# Patient Record
Sex: Female | Born: 1980 | Race: Black or African American | Hispanic: No | Marital: Single | Smoking: Never smoker
Health system: Southern US, Community
[De-identification: ages and names within clinical notes are randomized; demographics above are authoritative.]

## PROBLEM LIST (undated history)

## (undated) DIAGNOSIS — J45909 Unspecified asthma, uncomplicated: Secondary | ICD-10-CM

## (undated) DIAGNOSIS — L309 Dermatitis, unspecified: Secondary | ICD-10-CM

## (undated) HISTORY — DX: Dermatitis, unspecified: L30.9

## (undated) HISTORY — PX: HERNIA REPAIR: SHX51

---

## 2004-10-21 ENCOUNTER — Emergency Department (HOSPITAL_COMMUNITY): Admission: EM | Admit: 2004-10-21 | Discharge: 2004-10-21 | Payer: Self-pay | Admitting: Emergency Medicine

## 2005-07-07 ENCOUNTER — Emergency Department (HOSPITAL_COMMUNITY): Admission: EM | Admit: 2005-07-07 | Discharge: 2005-07-08 | Payer: Self-pay | Admitting: Emergency Medicine

## 2005-08-21 ENCOUNTER — Inpatient Hospital Stay (HOSPITAL_COMMUNITY): Admission: AD | Admit: 2005-08-21 | Discharge: 2005-08-21 | Payer: Self-pay | Admitting: Family Medicine

## 2005-09-03 ENCOUNTER — Inpatient Hospital Stay (HOSPITAL_COMMUNITY): Admission: AD | Admit: 2005-09-03 | Discharge: 2005-09-03 | Payer: Self-pay | Admitting: Obstetrics

## 2005-09-15 ENCOUNTER — Observation Stay (HOSPITAL_COMMUNITY): Admission: AD | Admit: 2005-09-15 | Discharge: 2005-09-15 | Payer: Self-pay | Admitting: Obstetrics

## 2005-10-09 ENCOUNTER — Ambulatory Visit (HOSPITAL_COMMUNITY): Admission: RE | Admit: 2005-10-09 | Discharge: 2005-10-09 | Payer: Self-pay | Admitting: Obstetrics

## 2005-11-20 ENCOUNTER — Inpatient Hospital Stay (HOSPITAL_COMMUNITY): Admission: AD | Admit: 2005-11-20 | Discharge: 2005-11-21 | Payer: Self-pay | Admitting: Obstetrics & Gynecology

## 2005-11-27 ENCOUNTER — Emergency Department: Payer: Self-pay | Admitting: Emergency Medicine

## 2006-02-17 ENCOUNTER — Inpatient Hospital Stay: Payer: Self-pay | Admitting: Obstetrics & Gynecology

## 2006-05-12 DIAGNOSIS — J45909 Unspecified asthma, uncomplicated: Secondary | ICD-10-CM | POA: Insufficient documentation

## 2006-12-27 ENCOUNTER — Emergency Department: Payer: Self-pay | Admitting: Emergency Medicine

## 2007-10-30 ENCOUNTER — Emergency Department: Payer: Self-pay | Admitting: Emergency Medicine

## 2011-08-10 DIAGNOSIS — F319 Bipolar disorder, unspecified: Secondary | ICD-10-CM | POA: Insufficient documentation

## 2012-10-01 ENCOUNTER — Ambulatory Visit: Payer: Self-pay | Admitting: Oncology

## 2012-10-13 ENCOUNTER — Inpatient Hospital Stay: Payer: Self-pay | Admitting: Student

## 2012-10-13 LAB — COMPREHENSIVE METABOLIC PANEL
Albumin: 3.6 g/dL (ref 3.4–5.0)
Alkaline Phosphatase: 111 U/L (ref 50–136)
Anion Gap: 6 — ABNORMAL LOW (ref 7–16)
BUN: 9 mg/dL (ref 7–18)
Bilirubin,Total: 0.6 mg/dL (ref 0.2–1.0)
Calcium, Total: 8.7 mg/dL (ref 8.5–10.1)
Chloride: 106 mmol/L (ref 98–107)
Co2: 23 mmol/L (ref 21–32)
Creatinine: 0.95 mg/dL (ref 0.60–1.30)
EGFR (African American): >60
EGFR (Non-African Amer.): >60
Glucose: 114 mg/dL — ABNORMAL HIGH (ref 65–99)
Osmolality: 270 (ref 275–301)
Potassium: 3.6 mmol/L (ref 3.5–5.1)
SGOT(AST): 43 U/L — ABNORMAL HIGH (ref 15–37)
SGPT (ALT): 55 U/L (ref 12–78)
Sodium: 135 mmol/L — ABNORMAL LOW (ref 136–145)
Total Protein: 7.8 g/dL (ref 6.4–8.2)

## 2012-10-13 LAB — CBC
HCT: 43 % (ref 35.0–47.0)
HGB: 14.5 g/dL (ref 12.0–16.0)
MCH: 29.4 pg (ref 26.0–34.0)
MCHC: 33.7 g/dL (ref 32.0–36.0)
MCV: 87 fL (ref 80–100)
Platelet: 207 10*3/uL (ref 150–440)
RBC: 4.94 10*6/uL (ref 3.80–5.20)
RDW: 13.2 % (ref 11.5–14.5)
WBC: 5.9 10*3/uL (ref 3.6–11.0)

## 2012-10-13 LAB — CK TOTAL AND CKMB (NOT AT ARMC)
CK, Total: 182 U/L (ref 21–215)
CK-MB: 2.8 ng/mL (ref 0.5–3.6)

## 2012-10-13 LAB — TROPONIN I: Troponin-I: <0.02 ng/mL

## 2012-10-14 LAB — DRUG SCREEN, URINE
Amphetamines, Ur Screen: NEGATIVE (ref ?–1000)
Barbiturates, Ur Screen: NEGATIVE (ref ?–200)
Benzodiazepine, Ur Scrn: NEGATIVE (ref ?–200)
Cannabinoid 50 Ng, Ur ~~LOC~~: POSITIVE (ref ?–50)
Cocaine Metabolite,Ur ~~LOC~~: POSITIVE (ref ?–300)
MDMA (Ecstasy)Ur Screen: NEGATIVE (ref ?–500)
Methadone, Ur Screen: NEGATIVE (ref ?–300)
Opiate, Ur Screen: POSITIVE (ref ?–300)
Phencyclidine (PCP) Ur S: NEGATIVE (ref ?–25)
Tricyclic, Ur Screen: NEGATIVE (ref ?–1000)

## 2012-10-14 LAB — RAPID HIV-1/2 QL/CONFIRM: HIV-1/2,Rapid Ql: NEGATIVE

## 2012-10-14 LAB — PREGNANCY, URINE: Pregnancy Test, Urine: NEGATIVE m[IU]/mL

## 2012-10-14 LAB — LACTATE DEHYDROGENASE: LDH: 409 U/L — ABNORMAL HIGH (ref 81–246)

## 2012-10-15 LAB — COMPREHENSIVE METABOLIC PANEL
Albumin: 3.1 g/dL — ABNORMAL LOW (ref 3.4–5.0)
Alkaline Phosphatase: 108 U/L (ref 50–136)
Anion Gap: 4 — ABNORMAL LOW (ref 7–16)
BUN: 6 mg/dL — ABNORMAL LOW (ref 7–18)
Bilirubin,Total: 0.3 mg/dL (ref 0.2–1.0)
Calcium, Total: 8.8 mg/dL (ref 8.5–10.1)
Chloride: 104 mmol/L (ref 98–107)
Co2: 26 mmol/L (ref 21–32)
Creatinine: 0.99 mg/dL (ref 0.60–1.30)
EGFR (African American): >60
EGFR (Non-African Amer.): >60
Glucose: 94 mg/dL (ref 65–99)
Osmolality: 266 (ref 275–301)
Potassium: 4.1 mmol/L (ref 3.5–5.1)
SGOT(AST): 42 U/L — ABNORMAL HIGH (ref 15–37)
SGPT (ALT): 47 U/L (ref 12–78)
Sodium: 134 mmol/L — ABNORMAL LOW (ref 136–145)
Total Protein: 7.3 g/dL (ref 6.4–8.2)

## 2012-10-17 LAB — PLATELET COUNT: Platelet: 238 10*3/uL (ref 150–440)

## 2012-10-19 LAB — CULTURE, BLOOD (SINGLE)

## 2012-10-24 ENCOUNTER — Ambulatory Visit: Payer: Self-pay | Admitting: Internal Medicine

## 2012-10-27 LAB — BRONCHIAL WASH CULTURE

## 2012-10-28 LAB — PATHOLOGY REPORT

## 2012-11-14 LAB — CULTURE, FUNGUS WITHOUT SMEAR

## 2013-01-16 ENCOUNTER — Emergency Department: Payer: Self-pay | Admitting: Emergency Medicine

## 2013-01-20 LAB — BETA STREP CULTURE(ARMC)

## 2013-04-15 ENCOUNTER — Emergency Department: Payer: Self-pay | Admitting: Emergency Medicine

## 2014-11-23 NOTE — H&P (Signed)
PATIENT NAME:  Yvette Jimenez, Yvette Jimenez MR#:  [id number] DATE OF BIRTH:  1980-09-10  DATE OF ADMISSION:  10/14/2012  PRIMARY CARE PHYSICIAN:  Dr. Kate Sable.   REFERRING PHYSICIAN:  Dr. Enedina Finner.   CHIEF COMPLAINT:  Right-sided chest pain and shortness of breath.   HISTORY OF PRESENT ILLNESS:  The patient is a 34 year old African American female with healthy past medical history apart from asthma.  She is also alcoholic.  She works as a Leisure centre manager.  She was in her usual state of health until about the last 24 hours when she woke up with right-sided chest pain described as sharp pain.  Severity is about 8 on a scale of 10.  It is pleuritic in nature, worse upon deep breathing and also whenever she lies down.  This is associated with shortness of breath.  She has a little cough, but she states it is not unusual for her to have a cough.  She has low-grade fever reaching 100.2.  The patient also describes some chills yesterday.  Prior to that, she was fine.  Evaluation here at the Emergency Department with chest x-ray and CAT scan of the chest with IV contrast showed no evidence of pulmonary embolism, however there are patchy infiltrates of pneumonia in her lungs.  Additionally, there is a prominent hilar and mediastinal lymphadenopathy raising suspicion concern about whether she has lymphoma.  The patient was admitted for further evaluation and treatment.   REVIEW OF SYSTEMS:  CONSTITUTIONAL:  Reports a low-grade fever.  Here it measured 100.2.  She had a few chills yesterday.  Denies night sweats, she said mild, but this is not unusual for her.  No fatigue.  No recent history of weight loss.  EYES:  No blurring of vision.  No double vision.  EARS, NOSE, THROAT:  No hearing impairment.  No sore throat.  No dysphagia.  CARDIOVASCULAR:  Reported chest pain as above and shortness of breath.  No edema.  No syncope.  RESPIRATORY:  A little cough if any, chest pain and shortness of breath as above.  No hemoptysis.  No  sputum production.  GASTROINTESTINAL:  No abdominal pain, no vomiting, no diarrhea.  GENITOURINARY:  No dysuria.  No frequency of urination.  No vaginal bleed.  MUSCULOSKELETAL:  No joint pain or swelling.  No muscular pain or swelling.  INTEGUMENTARY:  No skin rash.  No ulcers.  NEUROLOGY:  No focal weakness.  No seizure activity.  No headache.  PSYCHIATRY:  No anxiety.  No depression.  ENDOCRINE:  No polyuria or polydipsia.  No heat or cold intolerance.  HEMATOLOGY:  No easy bruisability.  No lymph node enlargement by palpation.   PAST MEDICAL HISTORY:  Asthma, otherwise healthy.   PAST SURGICAL HISTORY:  Right inguinal hernia repair in 2002.   SOCIAL HABITS:  Nonsmoker, however she drinks alcohol, usually tequila and vodka, about five shots a day, at least for the last three months.  No other drug abuse.   SOCIAL HISTORY:  She is married, but right now she is separated.  She works as a Leisure centre manager.   FAMILY HISTORY:  She indicates that her mother has a hole in her heart.  She has no information about her father.  She has brothers and sisters.  All are healthy.   ADMISSION MEDICATIONS:  None except for occasional albuterol as needed use and she uses Depo injections every few months.  She did not have any menstrual period for the last six months.   ALLERGIES:  DEMEROL CAUSING SIDE EFFECTS WITH NAUSEA AND VOMITING.  LEVAQUIN AND MOTRIN.   PHYSICAL EXAMINATION: VITAL SIGNS:  Blood pressure 128/69, respiratory rate 24, pulse 90, temperature 98.9, repeat temperature was 100.2.  Oxygen saturation 98%.  GENERAL APPEARANCE:  Young female lying in bed in no acute distress.  HEAD AND NECK:  No pallor.  No icterus.  No cyanosis.  EARS, NOSE, THROAT:  Ear examination revealed normal hearing.  No discharge.  No ulcers.  Nasal mucosa was normal without ulcers, no bleeding.  No discharge.  Oropharyngeal area was normal without ulcers, no oral thrush.  EYES:  Revealed normal eyelids and conjunctivae.   Pupils about 6 mm, equal and reactive to light.  NECK:  Supple.  Trachea at midline.  No thyromegaly.  No cervical lymphadenopathy.  No submental lymph nodes or supraclavicular.   HEART:  Revealed normal S1, S2.  No S3 or S4.  No murmur.  No gallop.  No carotid bruits.  RESPIRATORY:  Revealed normal breathing pattern without use of accessory muscles.  No rales.  No wheezing.  ABDOMEN:  Soft without tenderness.  No hepatosplenomegaly.  No masses.  No hernias.  SKIN:  Revealed no ulcers.  No subcutaneous nodules.  MUSCULOSKELETAL:  No joint swelling.  No clubbing.  NEUROLOGIC:  Cranial nerves II through XII are intact.  No focal motor deficit.  PSYCHIATRIC:  The patient is alert and oriented x 3.  Mood and affect were normal.  LYMPHATIC:  No cervical or supraclavicular lymph nodes.  No axillary or inguinal lymph nodes.   LABORATORY FINDINGS AND RADIOLOGIC DATA:  Chest x-ray showed mediastinal and hilar lymphadenopathy associated with bilateral increased interstitial markings and confluent density in the right apex.  CAT scan of the chest with contrast showed extensive soft tissue density in the mediastinal and bilateral hilar regions concerning for possible lymphoma.  Patchy and bilateral infiltrates in both lungs.  No evidence of large central pulmonary embolic filling defects.  Serum glucose 114, BUN 9, creatinine 0.9, sodium 135, potassium 3.6.  Liver function tests were normal.  AST is slightly elevated at 43, normal ALT of 55.  CPK 185.  Troponin less than 0.02.  CBC showed white count of 5000, hemoglobin 14, hematocrit 43, platelet count 207.   ASSESSMENT: 1.  Patchy pneumonia.  2.  Pleuritic chest pain.  3.  Hilar and mediastinal lymphadenopathy raising suspicion for lymphoma.   4.  Chronic alcoholism.  5.  Mild intermittent asthma.   PLAN:  We will admit the patient to the medical floor.  Blood cultures x 2.  Empiric IV antibiotic using Rocephin and Zithromax.  Check HIV status.  Also, I  will check LDH.  DuoNebs treatment.  Oxygen supplementation.  Consult infectious disease speciality and consult hematology oncology.  Watch for any withdrawal symptoms from alcohol, although patient states that she is fine and at times she may not drink without DTs.  I will add multivitamin and thiamine.  Ativan 1 mg q. 4 hours as needed.  Urine for pregnancy test.   TIME SPENT IN EVALUATING THIS PATIENT:  Took more than 55 minutes.     ____________________________ Carney CornersAmir M. Rudene Rearwish, MD amd:ea D: 10/14/2012 00:21:45 ET T: 10/14/2012 01:09:04 ET JOB#: [id number]  cc: Carney CornersAmir M. Rudene Rearwish, MD, <Dictator> Karolee OhsAMIR Dala DockM DARWISH MD ELECTRONICALLY SIGNED 10/14/2012 6:20

## 2014-11-23 NOTE — Consult Note (Signed)
CT scan of the abdomen and pelvis was unrevealing.  Malignancy is unlikely,  but still in the differential.  Agree with Dr. Clovis FredricksonKasa's assessment that patient will require bronchoscopy.  Recommend repeating CT scan in 3 months to assess for interval change.  Appreciate consult, call with questions.  Electronic Signatures: Gerarda FractionFinnegan, Gretchen Weinfeld (MD)  (Signed on 16-Mar-14 10:34)  Authored  Last Updated: 16-Mar-14 10:34 by Gerarda FractionFinnegan, Avenell Sellers (MD)

## 2014-11-23 NOTE — Consult Note (Signed)
History of Present Illness:  Reason for Consult Mediastinal lymphadenopathy, concerns for underlying lymphoma.   HPI   Patient is a 34 year old female with no significant past medical history who noted some increasing fatigue and low-grade fevers over the past 3-4 days.  Prior to this she was in her usual state of health.  She developed acute onset chest pain which brought her daily are for further evaluation.  Currently her symptoms have improved.  She has no neurologic complaints.  She is a good appetite and denies weight loss.  She denies any cough or shortness of breath.  She has no night sweats.  She denies any nausea, vomiting, constipation, or diarrhea.  She has no urinary complaints.  Patient otherwise feels well an further specific complaints.  PFSH:  Additional Past Medical and Surgical History Asthma, right inguinal hernia repair in 2002.   Social history: Daily alcohol use, positive cocaine, denies tobacco.    Family history:  Negative and noncontributory.   Review of Systems:  Performance Status (ECOG) 0   Review of Systems   As per HPI. Otherwise, 10 point system review was negative.   NURSING NOTES: **Vital Signs.:   14-Mar-14 13:56   Vital Signs Type: Routine   Temperature Temperature (F): 98.4   Celsius: 36.8   Temperature Source: oral   Pulse Pulse: 104   Respirations Respirations: 20   Systolic BP Systolic BP: 710   Diastolic BP (mmHg) Diastolic BP (mmHg): 74   Mean BP: 85   Pulse Ox % Pulse Ox %: 95   Pulse Ox Activity Level: At rest   Oxygen Delivery: Room Air/ 21 %   Physical Exam:  Physical Exam General: Well-developed, well-nourished, no acute distress. Eyes: Pink conjunctiva, anicteric sclera. HEENT: Normocephalic, moist mucous membranes, clear oropharnyx. Lungs: Clear to auscultation bilaterally. Heart: Regular rate and rhythm. No rubs, murmurs, or gallops. Abdomen: Soft, nontender, nondistended. No organomegaly noted, normoactive  bowel sounds. Musculoskeletal: No edema, cyanosis, or clubbing. Neuro: Alert, answering all questions appropriately. Cranial nerves grossly intact. Skin: No rashes or petechiae noted. Psych: Normal affect. Lymphatics: No cervical, calvicular, axillary or inguinal LAD.    Demerol: N/V  Levaquin: Unknown  Motrin: Unknown  Laboratory Results: Hepatic:  13-Mar-14 16:19   Bilirubin, Total 0.6  Alkaline Phosphatase 111  SGPT (ALT) 55  SGOT (AST)  43  Total Protein, Serum 7.8  Albumin, Serum 3.6  Routine Chem:  13-Mar-14 16:19   LDH, Serum  409 (Result(s) reported on 14 Oct 2012 at 12:28AM.)  Glucose, Serum  114  BUN 9  Creatinine (comp) 0.95  Sodium, Serum  135  Potassium, Serum 3.6  Chloride, Serum 106  CO2, Serum 23  Calcium (Total), Serum 8.7  Osmolality (calc) 270  eGFR (African American) >60  eGFR (Non-African American) >60 (eGFR values <32m/min/1.73 m2 may be an indication of chronic kidney disease (CKD). Calculated eGFR is useful in patients with stable renal function. The eGFR calculation will not be reliable in acutely ill patients when serum creatinine is changing rapidly. It is not useful in  patients on dialysis. The eGFR calculation may not be applicable to patients at the low and high extremes of body sizes, pregnant women, and vegetarians.)  Anion Gap  6  Cardiac:  13-Mar-14 16:19   CK, Total 182  CPK-MB, Serum 2.8 (Result(s) reported on 13 Oct 2012 at 04:47PM.)  Troponin I < 0.02 (0.00-0.05 0.05 ng/mL or less: NEGATIVE  Repeat testing in 3-6 hrs  if clinically indicated. >0.05 ng/mL: POTENTIAL  MYOCARDIAL INJURY. Repeat  testing in 3-6 hrs if  clinically indicated. NOTE: An increase or decrease  of 30% or more on serial  testing suggests a  clinically important change)  Routine Sero:  13-Mar-14 16:19   Rapid HIV 1/2 Ab Test with Confirmation ([facility]) NEG - HIV 1/2 AB This is a screening test for the presence of HIV-1/2 antibodies. False-negative  and false-positive results can occur. All preliminary positive samples are sent for confirmatory testing. Clinical correlation is necessary to assess whether repeat testing may be needed for negative results.  Routine Hem:  13-Mar-14 16:19   WBC (CBC) 5.9  RBC (CBC) 4.94  Hemoglobin (CBC) 14.5  Hematocrit (CBC) 43.0  Platelet Count (CBC) 207 (Result(s) reported on 13 Oct 2012 at 04:39PM.)  MCV 87  MCH 29.4  MCHC 33.7  RDW 13.2   Assessment and Plan: Impression:   Lymphadenopathy. Plan:   1.  Lymphadenopathy: Possibly reactive in nature secondary to her underlying pulmonary symptoms.  Will get CT of the abdomen and pelvis to assess for any further lymphadenopathy.  Case was discussed with Dr. Clayborn Bigness and agree with pulmonary consult for bronchoscopy and possible biopsy of her subcarinal lymph node which is enlarged.  If abdomen and pelvis CT is negative and bronchoscopy in either negative or inconclusive, would recommend repeating scan in 6-8 weeks after completion of antibiotic treatment to assess for interval change.  For completeness, peripheral blood flow cytometry was ordered but these results will not be available for 7-10 days. consult, will follow.  Electronic Signatures: Delight Hoh (MD)  (Signed 14-Mar-14 17:41)  Authored: HISTORY OF PRESENT ILLNESS, PFSH, ROS, NURSING NOTES, PE, ALLERGIES, LABS, ASSESSMENT AND PLAN   Last Updated: 14-Mar-14 17:41 by Delight Hoh (MD)

## 2014-11-23 NOTE — Consult Note (Signed)
PATIENT NAME:  Yvette Jimenez, Yvette Jimenez MR#:  [id number] DATE OF BIRTH:  1981-07-05  DATE OF CONSULTATION:  10/14/2012  REFERRING PHYSICIAN:  Dr. Winona LegatoVaickute. CONSULTING PHYSICIAN:  Rosalyn GessMichael E. Blocker, MD  REASON FOR CONSULTATION:  Abnormal CT scan.   HISTORY OF PRESENT ILLNESS:  The patient is a 34 year old female with a past history significant for exercise-induced asthma who was admitted on March 13 with relatively acute onset of right-sided chest pains and shortness of breath.  The patient states that she was in her usual state of health until a day before admission when she awoke with right-sided sharp chest pains below the right breast.  The pains radiated into the back.  She also had significant shortness of breath.  The pain was worse with deep inspiration.  It was also worse with lying down.  She had some cough, but she has some at baseline.  She has had no sputum production.  She denies any fevers, chills and sweats, although she had a temperature in the Emergency Room of 100.2.  She was admitted to the hospital and started on ceftriaxone and azithromycin.  A CT scan showed evidence for lymph node enlargement and a patchy infiltrate.  She has had no known sick contacts. She did have some swollen anterior cervical/submandibular nodes a few weeks ago, but these have decreased in size and are not currently bothering her.  She has not had any other URI symptoms.  She has not had any nausea, vomiting or change in her bowels.  She has multiple tattoos, but have always obtained them from a licensed parlor.  She has no injecting drug use history.  She has had no rashes.   ALLERGIES:  DEMEROL, MOTRIN AND LEVAQUIN, THE LATTER CAUSED A LOCAL RASH AROUND THE IV SITE THAT RESOLVED AFTER STOPPING THE INFUSION.   SOCIAL HISTORY:  The patient is married, but separated.  She works as a Leisure centre managerbartender.  She does not smoke.  She drinks heavily.  No injecting drug use history.  Multiple tattoos.    FAMILY HISTORY:  No history of  sarcoidosis.   REVIEW OF SYSTEMS:  GENERAL:  No fevers, chills or sweats.  No malaise.  No fatigue.  HEENT:  Some headache with coughing at times.  No sinus congestion.  No sore throat.  No nasal congestion.  NECK:  No stiffness.  She had some swollen glands in the neck a few weeks ago, but these improved.  RESPIRATORY:  Positive pleuritic chest pain and shortness of breath.  No significant sputum production.  Minimal cough.  CARDIAC:  Chest pains on the right as described above radiating to the back.  She has had some peripheral edema over the last month that is typically worse at the end of the day and gets better when she puts her feet up.  No palpitations.  GASTROINTESTINAL:  No nausea, no vomiting, no abdominal pain, no change in her bowels.  GENITOURINARY:  No change in her urine.  MUSCULOSKELETAL:  No myalgias or arthralgias.  No frank joint arthritis.  SKIN:  No rashes.  NEUROLOGIC:  No focal weakness.  PSYCHIATRIC:  No complaints.  All other systems are negative.   PHYSICAL EXAMINATION: VITAL SIGNS:  T-max of 98.4, T-current of 98.4.  She did have a temperature of 100.2 in the ER.  Pulse of 104, blood pressure 109/74, 95% on room air.  GENERAL:  A 34 year old black female in no acute distress.  HEENT:  Normocephalic, atraumatic.  Pupils equal and reactive to light.  Extraocular motion intact.  Sclerae, conjunctivae and lids without evidence for emboli or petechiae.  Oropharynx shows no erythema or exudate.  Teeth and gums are in good condition.  NECK:  Supple.  Full range of motion.  Midline trachea.  No lymphadenopathy.  No thyromegaly. LUNGS:  Clear to auscultation bilaterally.  She did have some discomfort with deep inspiration on the right.  She did have some discomfort on the right with deep inspiration.  CARDIAC:  Regular rate and rhythm without murmur, rub or gallop.  ABDOMEN:  Soft, nontender and nondistended.  No hepatosplenomegaly.  No hernias noted.  EXTREMITIES:  No  evidence for tenosynovitis.  SKIN:  She had multiple tattoos, none of which had any inflammatory reaction.  She had no other rashes.  No stigmata of endocarditis, specifically no Janeway lesions or Osler nodes.  NEUROLOGIC:  The patient was awake and interactive, moving all four extremities.  PSYCHIATRIC:  Mood and affect appeared normal.   LABORATORY DATA:  BUN of 9, creatinine 0.95, bicarbonate 23, anion gap of 6, AST 43, ALT 55, alkaline phosphatase 111, total bilirubin of 0.6.  White count of 5.9 with a hemoglobin 14.5, platelet count of 207.  Blood cultures show no growth to date.  A urine pregnancy test was negative.  Rapid HIV test was negative.  Chest x-ray showed mediastinal and hilar lymphadenopathy with bilateral interstitial markings.  A CT scan of the chest with contrast demonstrated extensive soft tissue density in the mediastinal and bilateral hilar regions in the lungs concerning for possible lymphoma, patchy bilateral infiltrates in both lungs in multiple locations.  There was no evidence for PE.   IMPRESSION:  A 34 year old female with a history of exercise-induced asthma admitted with an abnormal CT scan.   RECOMMENDATIONS:   1.  Her presentation of pain and shortness of breath without significant cough or sputum production and no fevers, chills or sweats (except for minimal temperature elevation on admission) with normal white count is not very suggestive of pneumonia.  She has been seen by oncology for possible lymphoma and they do not think that that diagnosis is too likely.  She is to go for a CT of the abdomen and pelvis to look for additional lymph nodes.  She has both lymphadenopathy and patchy infiltrates on CT.  This appears to be consistent with acute sarcoidosis.  I cannot rule out an atypical infection, however.  2.  We will ask pulmonary to see her for possible biopsy to look for sarcoidosis,  3.  We will change her antibiotics to doxycycline.  We will treat for 7 days.   4.  Her HIV test was negative making opportunistic infections unlikely.  Acute HIV would be a possibility, however.  We will send an HIV PCR.  5.  Cocaine effects of the lungs would be possible.  We will send a tox screen.    This is a highly complex infectious disease case.  Thank you very much for involving me in this patient's care.       ____________________________ Rosalyn Gess. Blocker, MD meb:ea D: 10/14/2012 15:27:00 ET T: 10/15/2012 00:26:15 ET JOB#: [id number]  cc: Rosalyn Gess. Blocker, MD, <Dictator> MICHAEL E BLOCKER MD ELECTRONICALLY SIGNED 10/17/2012 9:39

## 2014-11-23 NOTE — Discharge Summary (Signed)
PATIENT NAME:  Yvette Jimenez, Yvette Jimenez MR#:  [id number] DATE OF BIRTH:  January 03, 1981  DATE OF ADMISSION:  10/13/2012 DATE OF DISCHARGE:  10/17/2012  CONSULTANTS: Dr. Belia Heman from Pulmonary; Dr. Orlie Dakin from Hematology/Oncology; and Dr. Leavy Cella from Infectious Disease.   PRIMARY CARE PHYSICIAN: Dr. Kate Sable.    CHIEF COMPLAINT:  Right-sided chest pain; shortness of breath.   DISCHARGE DIAGNOSES: 1.  Chest pain, with diffuse pulmonary infiltrate and significant mediastinal and bilateral hilar region lymphadenopathy, unknown etiology; lymphoma versus reactive versus sarcoid.  2.  Possible atypical pneumonia.  3.  Polysubstance abuse including cocaine and marijuana.  4.  Mild hyponatremia.   DISCHARGE MEDICATIONS: Prednisone 50 mg for a week, 1 tablet daily; doxycycline  100 mg 2 times a day for 4 more days.   DISPOSITION: Home.   CODE STATUS: THE PATIENT IS A FULL CODE.   DIET: Regular.   ACTIVITY: As tolerated.   FOLLOWUP: Please follow with Dr. Belia Heman on Monday the 24th at 11:00 a.m. for endoscopic ultrasound. Please follow up with PCP within 1 to 2 weeks and undergo a repeat chest imaging for the lymphadenopathy.   LABS AND IMAGING: CT scan of chest with contrast showing extensive soft tissue density in the mediastinal and bilateral hilar region in the lung, concerning for possible lymphoma. Patchy bilateral infiltrates in both lungs, multiple locations. No thoracic aortic aneurysm or dissection. CT of abdomen and pelvis with contrast showing no lymphadenopathy in the abdomen or pelvis. Mild basilar pulmonary opacities, infectious or inflammatory in nature. X-ray of the chest, PA and lateral, showing mediastinal and hilar lymphadenopathy with bilateral increased interstitial markings. Initial sodium 135. LFTs on arrival: AST was 43, otherwise within normal limits. Troponin negative. U-tox positive for cocaine, cannabinoids, and opiates, which was taken the day after admission, however. WBC 5.9 on  arrival, hemoglobin 14.5 platelets are 207. Blood cultures: No growth to date. Rapid HIV negative. Pregnancy test negative. Echocardiogram showing normal EF.  HISTORY OF PRESENT ILLNESS AND HOSPITAL COURSE: For full details of the H and P, please see the dictation on 10/13/2012 by Dr. Rudene Re, but briefly this is a 34 year old African American female with a history of asthma, alcohol use, who presented with right-sided chest pain which was described as sharp, 8 out of 10, pleuritic in nature, associated with deep breathing and laying down. She was admitted to the Hospitalist Service for further evaluation and management as initial x-ray showed patchy, possibly pneumonia, with lymphadenopathy. CAT scan was checked. HIV status was checked. Infectious Disease and Oncology were consulted, as well as Pulmonary. The patient underwent a CAT scan of the chest with contrast, the result of which is dictated above. She was initially started on community-acquired pneumonia coverage, antibiotics; and after the patient was seen by Dr. Leavy Cella, they were changed to doxycycline, to be taken for 7 days for possible atypical pneumonia. However, the possibility of lymphoma versus sarcoid was raised. Furthermore, cocaine defect on the lung could also be possible, per Infectious Disease. She was seen by Dr. Belia Heman, and we had arranged an endoscopic ultrasound and a biopsy for this Wednesday, which the patient adamantly refused, stating that she has to go to work on Wednesday and could not be here. At this point, as she is asymptomatic, we will discharge her, and her endoscopic ultrasound with biopsy has been scheduled for the following Monday. At this point, she is not hypoxic, is not short of breath, and ambulating without significant symptoms. She was seen by Dr. Orlie Dakin from Oncology  as well. It is possible that the patient has lymphoma; however, we would need a definitive diagnosis for that. It is recommended for her to follow with  a CAT scan in 3 months, and she verbalized understanding. At this point, she will be discharged with prednisone for 7 days in case this is sarcoid per Dr. Belia HemanKasa.  TOTAL TIME SPENT: 40 minutes.   The patient is FULL CODE.     ____________________________ Krystal EatonShayiq Kazi Reppond, MD sa:dm D: 10/17/2012 11:01:25 ET T: 10/17/2012 11:57:33 ET JOB#: [id number]  cc: Krystal EatonShayiq Priest Lockridge, MD, <Dictator> Deloris PingPhilip J. Luella Cookosenow, MD Dory LarsenKurian D. Kasa, MD Tollie Pizzaimothy J. Orlie DakinFinnegan, MD    Krystal EatonSHAYIQ Zed Wanninger MD ELECTRONICALLY SIGNED 10/25/2012 13:27

## 2014-11-23 NOTE — Consult Note (Signed)
Impression:    34yo female w/ h/o exercise induced asthma admitted with abnormal CT.     Her presentation of pain and SOB without significant cough or sputum production and no fever, chills or sweats (except temp on admission) with normal WBC is not very suggestive of pneumonia.  She has been seen by oncology for possible lymphoma and they do not think that diagnosis too likely.  To go for CT abd/pelvis to look for additional LNs.  She has both LAN and patchy infiltrate on CT.  This appears to be consistent with acute sarcoidosis.  I cannot rule out an atypical infection, however.    Will ask pulmonary to see her for possible biopsy to look for sarcoidosis.    Will change her antibiotics to doxycycline.  Would treat for 7 days.    HIV test was negative making opportunistic infe5)     Acute HIV would be possible.  Will send an HIV PCR.ns unlikely.5    Acute HIV would be possible.  Will send an HIV PCR. 6)     Cocaine effects on the lungs would be possible.  Will send a tox screen.  Electronic Signatures: Blocker MPH, Rosalyn GessMichael E (MD) (Signed on 14-Mar-14 15:17)  Authored   Last Updated: 14-Mar-14 15:27 by Blocker MPH, Rosalyn GessMichael E (MD)
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
## 2018-01-30 ENCOUNTER — Emergency Department
Admission: EM | Admit: 2018-01-30 | Discharge: 2018-01-30 | Disposition: A | Discharge status: Home / Self Care | Payer: Self-pay | Attending: Emergency Medicine | Admitting: Emergency Medicine

## 2018-01-30 ENCOUNTER — Encounter: Payer: Self-pay | Admitting: Emergency Medicine

## 2018-01-30 ENCOUNTER — Other Ambulatory Visit: Payer: Self-pay

## 2018-01-30 DIAGNOSIS — H00012 Hordeolum externum right lower eyelid: Secondary | ICD-10-CM | POA: Insufficient documentation

## 2018-01-30 MED ORDER — POLYMYXIN B-TRIMETHOPRIM 10000-0.1 UNIT/ML-% OP SOLN: 2.0000 [drp] | Freq: Four times a day (QID) | OPHTHALMIC | 0 refills | Status: DC

## 2018-01-30 NOTE — ED Triage Notes (Signed)
Pt in via POV with complaints of stye and irritation to right eye since Friday.  Pt reports worsening irritation, burning, swelling.  Vitals WDL, NAD noted at this time.

## 2018-01-30 NOTE — ED Notes (Signed)
First Nurse Note: Pt to ED c/o eye pain. Pt is in NAD at this time.

## 2018-01-30 NOTE — ED Provider Notes (Signed)
[facility] Emergency Department Provider Note  ____________________________________________  Time seen: Approximately 3:53 PM  I have reviewed the triage vital signs and the nursing notes.   HISTORY  Chief Complaint Eye Pain    HPI Yvette Jimenez is a 37 y.o. female who presents the emergency department complaining of pain, swelling, redness to the right lower eyelid.  Patient reports that she has a hard "knot" in the right lateral aspect of the eyelid.  This was appreciated 2 days ago.  Patient denies any visual changes, drainage, conjunctival irritation or erythema.  No trauma to the eye.  Patient does not wear glasses or contacts.  Patient has attempted cold packs, heat packs, saline eyedrops.  Patient denies any other complaints.  No other symptoms.  No prescription or over-the-counter medications for this complaint.  History reviewed. No pertinent past medical history.  There are no active problems to display for this patient.   Past Surgical History:  Procedure Laterality Date  . HERNIA REPAIR      Prior to Admission medications   Medication Sig Start Date End Date Taking? Authorizing Provider  trimethoprim-polymyxin b (POLYTRIM) ophthalmic solution Place 2 drops into the right eye every 6 (six) hours. 01/30/18   Jameria Bradway, Delorise Royals, PA-C    Allergies Levaquin [levofloxacin] and Latex  No family history on file.  Social History Social History   Tobacco Use  . Smoking status: Never Smoker  . Smokeless tobacco: Never Used  Substance Use Topics  . Alcohol use: Yes  . Drug use: Yes    Types: Marijuana     Review of Systems  Constitutional: No fever/chills Eyes: No visual changes. No discharge.  "Knot" to the right lower eyelid ENT: No upper respiratory complaints. Cardiovascular: no chest pain. Respiratory: no cough. No SOB. Gastrointestinal: No abdominal pain.  No nausea, no vomiting.   Musculoskeletal: Negative for musculoskeletal  pain. Skin: Negative for rash, abrasions, lacerations, ecchymosis. Neurological: Negative for headaches, focal weakness or numbness. 10-point ROS otherwise negative.  ____________________________________________   PHYSICAL EXAM:  VITAL SIGNS: ED Triage Vitals  Enc Vitals Group     BP 01/30/18 1534 121/88     Pulse Rate 01/30/18 1534 78     Resp 01/30/18 1534 16     Temp 01/30/18 1534 98.5 F (36.9 C)     Temp Source 01/30/18 1534 Oral     SpO2 01/30/18 1534 100 %     Weight 01/30/18 1535 160 lb (72.6 kg)     Height 01/30/18 1535 5\' 7"  (1.702 m)     Head Circumference --      Peak Flow --      Pain Score 01/30/18 1534 6     Pain Loc --      Pain Edu? --      Excl. in GC? --      Constitutional: Alert and oriented. Well appearing and in no acute distress. Eyes: Conjunctivae are normal. PERRL. EOMI. visualization of the right lower eyelid reveals tender, mobile, lesion to the right lateral lower eyelid.  Minimal erythema.  Minimal surrounding edema.  No indication of cellulitis. Head: Atraumatic. ENT:      Ears:       Nose: No congestion/rhinnorhea.      Mouth/Throat: Mucous membranes are moist.  Neck: No stridor.    Cardiovascular: Normal rate, regular rhythm. Normal S1 and S2.  Good peripheral circulation. Respiratory: Normal respiratory effort without tachypnea or retractions. Lungs CTAB. Good air entry to the bases  with no decreased or absent breath sounds. Musculoskeletal: Full range of motion to all extremities. No gross deformities appreciated. Neurologic:  Normal speech and language. No gross focal neurologic deficits are appreciated.  Skin:  Skin is warm, dry and intact. No rash noted. Psychiatric: Mood and affect are normal. Speech and behavior are normal. Patient exhibits appropriate insight and judgement.   ____________________________________________   LABS (all labs ordered are listed, but only abnormal results are displayed)  Labs Reviewed - No data to  display ____________________________________________  EKG   ____________________________________________  RADIOLOGY   No results found.  ____________________________________________    PROCEDURES  Procedure(s) performed:    Procedures    Medications - No data to display   ____________________________________________   INITIAL IMPRESSION / ASSESSMENT AND PLAN / ED COURSE  Pertinent labs & imaging results that were available during my care of the patient were reviewed by me and considered in my medical decision making (see chart for details).  Review of the [place] CSRS was performed in accordance of the [facility] prior to dispensing any controlled drugs.      Patient's diagnosis is consistent with hordeolum to the right lower eyelid.  Patient presents the emergency department complaining of "knot" to the right lower eyelid.  Exam is most consistent with a hordeolum.  Differential included hordeolum, preseptal cellulitis, periorbital cellulitis, conjunctivitis.  Exam was overall reassuring with no indication for further work-up.  Patient is encouraged to use warm hot compresses for same.  If symptoms do not improve, patient will be prescribed an antibiotic eyedrop.  If no improvement after 2 to 3 weeks, patient will follow-up with ophthalmology for incision and drainage.. Patient will be discharged home with prescriptions for Polytrim. Patient is to follow up with ophthalmology as needed or otherwise directed. Patient is given ED precautions to return to the ED for any worsening or new symptoms.     ____________________________________________  FINAL CLINICAL IMPRESSION(S) / ED DIAGNOSES  Final diagnoses:  Hordeolum externum of right lower eyelid      NEW MEDICATIONS STARTED DURING THIS VISIT:  ED Discharge Orders        Ordered    trimethoprim-polymyxin b (POLYTRIM) ophthalmic solution  Every 6 hours     01/30/18 1621          This chart was dictated using  voice recognition software/Dragon. Despite best efforts to proofread, errors can occur which can change the meaning. Any change was purely unintentional.    Racheal PatchesCuthriell, Aneri Slagel D, PA-C 01/30/18 1626    Don PerkingVeronese, [place], MD 01/31/18 2252

## 2018-04-20 LAB — HM PAP SMEAR: HM Pap smear: POSITIVE

## 2018-05-16 ENCOUNTER — Ambulatory Visit: Payer: Self-pay | Attending: Oncology

## 2018-05-16 ENCOUNTER — Ambulatory Visit: Payer: Self-pay | Admitting: Obstetrics and Gynecology

## 2018-12-22 LAB — HM HIV SCREENING LAB: HM HIV Screening: NEGATIVE

## 2019-03-15 DIAGNOSIS — F319 Bipolar disorder, unspecified: Secondary | ICD-10-CM

## 2019-03-16 ENCOUNTER — Encounter: Payer: Self-pay | Admitting: Physician Assistant

## 2019-03-16 ENCOUNTER — Ambulatory Visit (LOCAL_COMMUNITY_HEALTH_CENTER): Payer: Medicaid Other | Admitting: Physician Assistant

## 2019-03-16 ENCOUNTER — Other Ambulatory Visit: Payer: Self-pay

## 2019-03-16 VITALS — BP 101/70 | Ht 67.0 in | Wt 188.2 lb

## 2019-03-16 DIAGNOSIS — Z3042 Encounter for surveillance of injectable contraceptive: Secondary | ICD-10-CM

## 2019-03-16 DIAGNOSIS — Z113 Encounter for screening for infections with a predominantly sexual mode of transmission: Secondary | ICD-10-CM

## 2019-03-16 DIAGNOSIS — Z01419 Encounter for gynecological examination (general) (routine) without abnormal findings: Secondary | ICD-10-CM | POA: Diagnosis not present

## 2019-03-16 DIAGNOSIS — Z30013 Encounter for initial prescription of injectable contraceptive: Secondary | ICD-10-CM | POA: Diagnosis not present

## 2019-03-16 DIAGNOSIS — Z3009 Encounter for other general counseling and advice on contraception: Secondary | ICD-10-CM | POA: Diagnosis not present

## 2019-03-16 LAB — WET PREP FOR TRICH, YEAST, CLUE
Trichomonas Exam: NEGATIVE
Yeast Exam: NEGATIVE

## 2019-03-16 MED ORDER — THERA VITAL M PO TABS: 1.0000 | ORAL_TABLET | Freq: Every day | ORAL | 0 refills | Status: DC

## 2019-03-16 MED ORDER — MEDROXYPROGESTERONE ACETATE 150 MG/ML IM SUSP
150.0000 mg | Freq: Once | INTRAMUSCULAR | Status: AC
  Administered 2019-03-16: 10:00:00 150 mg via INTRAMUSCULAR

## 2019-03-16 NOTE — Progress Notes (Addendum)
Here for Depo at 12 wks, since last Depo. (Last PE 04/2018). Also here for STD screening.Jenetta Downer, RN

## 2019-03-16 NOTE — Progress Notes (Signed)
Wet mount reviewed, no treatment indicated. Depo given, left deltoid, tolerated well, next Depo card given..Britain Anagnos Brewer-Jensen, RN  

## 2019-03-17 NOTE — Progress Notes (Signed)
Family Planning Visit- Repeat Yearly Visit  Subjective:  Elder Cypherseko C Hightower is a 38 y.o. being seen today for a Depo visit and requests a STD screening as well.    She is currently using Depo-Provera injections for pregnancy prevention. Patient reports she does not want a pregnancy in the next year. Patient  has Bipolar depression (H[facility]) and Asthma on their problem list.  Chief Complaint  Patient presents with  . Contraception  . SEXUALLY TRANSMITTED DISEASE    Patient reports that she is doing well with the Depo and wants to continue with it for now.  Reports that she has been drinking a lot of soda recently and urinating more frequently.   Patient denies any vaginal symptoms, any changes to person and family history.   Does the patient desire a pregnancy in the next year? (OKQ flowsheet)  See flowsheet for other program required questions.   Body mass index is 29.48 kg/m. - Patient is eligible for diabetes screening based on BMI and age 76>40?  not applicable HA1C ordered? not applicable  Patient reports 1 of partners in last year. Desires STI screening?  Yes  Does the patient have a current or past history of drug use? No   No components found for: HCV]   Health Maintenance Due  Topic Date Due  . TETANUS/TDAP  06/13/1999  . INFLUENZA VACCINE  03/04/2019    Review of Systems  All other systems reviewed and are negative.   The following portions of the patient's history were reviewed and updated as appropriate: allergies, current medications, past family history, past medical history, past social history, past surgical history and problem list. Problem list updated.  Objective:   Vitals:   03/16/19 0902  BP: 101/70  Weight: 188 lb 3.2 oz (85.4 kg)  Height: 5\' 7"  (1.702 m)    Physical Exam Vitals signs reviewed.  Constitutional:      General: She is not in acute distress.    Appearance: Normal appearance.  HENT:     Head: Normocephalic and atraumatic.      Mouth/Throat:     Mouth: Mucous membranes are moist.     Pharynx: Oropharynx is clear. No oropharyngeal exudate or posterior oropharyngeal erythema.  Neck:     Musculoskeletal: Neck supple.  Pulmonary:     Effort: Pulmonary effort is normal.  Abdominal:     Palpations: Abdomen is soft. There is no mass.     Tenderness: There is no abdominal tenderness. There is no guarding or rebound.  Genitourinary:    General: Normal vulva.     Rectum: Normal.     Comments: External genitalia/pubic area without nits, lice, edema, erythema, lesions and inguinal adenopathy. Vagina with normal mucosa and discharge. Cervix without visible lesions. Uterus normal size, firm, mobile, nt, no CMT, no masses, no adnexal tenderness or fullness.  Lymphadenopathy:     Cervical: No cervical adenopathy.  Skin:    General: Skin is warm and dry.     Findings: No bruising, erythema or rash.     Comments: Multiple tattoos  Neurological:     Mental Status: She is alert and oriented to person, place, and time.  Psychiatric:        Mood and Affect: Mood normal.        Behavior: Behavior normal.        Thought Content: Thought content normal.        Judgment: Judgment normal.       Assessment and Plan:  [facility]  TELIYAH ROYAL is a 38 y.o. female presenting to the [facility] Department for a family planning visit  Contraception counseling: Reviewed all forms of birth control options available including abstinence; over the counter/barrier methods; hormonal contraceptive medication including pill, patch, ring, injection,contraceptive implant; hormonal and nonhormonal IUDs; permanent sterilization options including vasectomy and the various tubal sterilization modalities. Risks and benefits reviewed.  Questions were answered.  Patient desires to continue with Depo , this was prescribed for patient. She will follow up in  3 months for surveillance.  She was told to call with any further questions, or with any  concerns about this method of contraception.  Emphasized use of condoms 100% of the time for STI prevention.to co  1. Encounter for counseling regarding contraception Continue with Depo 150 mg IM q 11-13 weeks x 2 Rec condoms with all sex for STD protection. RTC in ~12 weeks for RP if possible and Depo only if not doing PE yet. - medroxyPROGESTERone (DEPO-PROVERA) injection 150 mg - Multiple Vitamins-Minerals (MULTIVITAMIN) tablet; Take 1 tablet by mouth daily.  Dispense: 100 tablet; Refill: 0  2. Screening for STD (sexually transmitted disease) Counseled that RN will call if needs to RTC for treatment once results are back. - WET PREP FOR [place], YEAST, CLUE - Chlamydia/Gonorrhea [place] [facility] - HIV [place] [facility] - Syphilis Serology, [place] [facility]  3. Gynecologic exam normal Pelvic for STD screening done and normal.  - WET PREP FOR [place], YEAST, CLUE - Chlamydia/Gonorrhea [place] [facility]  4. Surveillance for Depo-Provera contraception OK for Depo today. RTC for next shot in 11-13 weeks. - medroxyPROGESTERone (DEPO-PROVERA) injection 150 mg  5. Family planning RN gave MVI per patient request. - Multiple Vitamins-Minerals (MULTIVITAMIN) tablet; Take 1 tablet by mouth daily.  Dispense: 100 tablet; Refill: 0     Return in about 11 weeks (around 06/01/2019) for Depo, Physical exam.  No future appointments.  Jerene Dilling, PA

## 2019-06-01 ENCOUNTER — Encounter: Payer: Self-pay | Admitting: Physician Assistant

## 2019-06-01 ENCOUNTER — Other Ambulatory Visit: Payer: Self-pay

## 2019-06-01 ENCOUNTER — Ambulatory Visit (LOCAL_COMMUNITY_HEALTH_CENTER): Payer: Medicaid Other | Admitting: Physician Assistant

## 2019-06-01 VITALS — BP 115/78 | Ht 68.0 in | Wt 188.0 lb

## 2019-06-01 DIAGNOSIS — Z3009 Encounter for other general counseling and advice on contraception: Secondary | ICD-10-CM | POA: Diagnosis not present

## 2019-06-01 DIAGNOSIS — Z30013 Encounter for initial prescription of injectable contraceptive: Secondary | ICD-10-CM | POA: Diagnosis not present

## 2019-06-01 DIAGNOSIS — Z3042 Encounter for surveillance of injectable contraceptive: Secondary | ICD-10-CM

## 2019-06-01 DIAGNOSIS — Z01419 Encounter for gynecological examination (general) (routine) without abnormal findings: Secondary | ICD-10-CM | POA: Diagnosis not present

## 2019-06-01 DIAGNOSIS — Z113 Encounter for screening for infections with a predominantly sexual mode of transmission: Secondary | ICD-10-CM

## 2019-06-01 LAB — WET PREP FOR TRICH, YEAST, CLUE
Trichomonas Exam: NEGATIVE
Yeast Exam: NEGATIVE

## 2019-06-01 MED ORDER — MEDROXYPROGESTERONE ACETATE 150 MG/ML IM SUSP
150.0000 mg | INTRAMUSCULAR | Status: AC
  Administered 2019-06-01: 150 mg via INTRAMUSCULAR

## 2019-06-01 NOTE — Progress Notes (Signed)
Depo given per C. [place] PA order. Tolerated well. Wet mount reviewed; no treatment per C. [place] PA. Aileen Fass, RN

## 2019-06-01 NOTE — Progress Notes (Signed)
Here today for Depo and STD screening. See FYI for last RP, CBE and Pap info. Hal Morales, RN

## 2019-06-01 NOTE — Progress Notes (Signed)
Family Planning Visit-  Subjective:  Yvette Jimenez is a 38 y.o. being seen today for Depo shot and requests STD screening today.    She is currently using Depo-Provera injections for pregnancy prevention. Patient reports she does not  want a pregnancy in the next year. Patient  has Bipolar depression ([place]) and Asthma on their problem list.  Chief Complaint  Patient presents with  . Contraception    Depo  . SEXUALLY TRANSMITTED DISEASE    STD screening    Patient reports that she is doing well with the Depo and desires to continue with this as BCM.  States that she likes to have screening q 3 months with depo even if she is not having symptoms.  States that she has not had any follow up re:  Pap with abnl cells from last year.    Patient denies any change in personal and family history since last RP.  Denies any symptoms and concerns today.    Does the patient desire a pregnancy in the next year? (OKQ flowsheet)  See flowsheet for other program required questions.   Body mass index is 28.59 kg/m. - Patient is eligible for diabetes screening based on BMI and age >06?  not applicable YI9S ordered? not applicable  Patient reports 1 of partners in last year. Desires STI screening?  Yes  Does the patient have a current or past history of drug use? No   No components found for: HCV]   Health Maintenance Due  Topic Date Due  . TETANUS/TDAP  06/13/1999    Review of Systems  All other systems reviewed and are negative.   The following portions of the patient's history were reviewed and updated as appropriate: allergies, current medications, past family history, past medical history, past social history, past surgical history and problem list. Problem list updated.  Objective:   Vitals:   06/01/19 0904  BP: 115/78  Weight: 188 lb (85.3 kg)  Height: 5\' 8"  (1.727 m)    Physical Exam Vitals signs reviewed.  Constitutional:      General: She is not in acute distress.  Appearance: Normal appearance.  HENT:     Head: Normocephalic and atraumatic.     Mouth/Throat:     Mouth: Mucous membranes are moist.     Pharynx: Oropharynx is clear. No oropharyngeal exudate or posterior oropharyngeal erythema.  Eyes:     Conjunctiva/sclera: Conjunctivae normal.  Neck:     Musculoskeletal: Neck supple.  Pulmonary:     Effort: Pulmonary effort is normal.  Abdominal:     Palpations: Abdomen is soft. There is no mass.     Tenderness: There is no abdominal tenderness. There is no guarding or rebound.  Genitourinary:    General: Normal vulva.     Rectum: Normal.     Comments: External genitalia/pubic area without nits, lice, edema, erythema, lesions and inguinal adenopathy. Vagina with normal mucosa and discharge. Cervix without visible lesions. Uterus firm, mobile, nt, no masses, no CMT, no adnexal tenderness or fullness. Lymphadenopathy:     Cervical: No cervical adenopathy.  Skin:    General: Skin is warm and dry.     Findings: No bruising, erythema, lesion or rash.  Neurological:     Mental Status: She is alert and oriented to person, place, and time.  Psychiatric:        Mood and Affect: Mood normal.        Behavior: Behavior normal.        Thought  Content: Thought content normal.        Judgment: Judgment normal.       Assessment and Plan:  Yvette Jimenez is a 38 y.o. female presenting to the [facility] Department for an initial well woman exam/family planning visit  Contraception counseling: Reviewed all forms of birth control options available including abstinence; over the counter/barrier methods; hormonal contraceptive medication including pill, patch, ring, injection,contraceptive implant; hormonal and nonhormonal IUDs; permanent sterilization options including vasectomy and the various tubal sterilization modalities. Risks and benefits reviewed.  Questions were answered.  Written information was also given to the patient to review.   Patient desires to continue with Depo, this was prescribed for patient. She will follow up in  3 months and prn for surveillance.  She was told to call with any further questions, or with any concerns about this method of contraception.  Emphasized use of condoms 100% of the time for STI prevention.  1. Encounter for counseling regarding contraception Counseled patient re:  SE of Depo and enc MVI 1 po QD, added Calcium with Vitamin D for bone health. Counseled re:  Weight bearing exercise. Rec condoms with all sex. - medroxyPROGESTERone (DEPO-PROVERA) injection 150 mg  2. Screening for STD (sexually transmitted disease) Patient without symptoms today. Await test results.  Counseled that RN will call if needs to RTC for any treatment once results are back.  - WET PREP FOR [place], YEAST, CLUE - Chlamydia/Gonorrhea [place] [facility] - HIV [place] [facility] - Syphilis Serology, [place] [facility]  3. Surveillance for Depo-Provera contraception OK for Depo 150mg  IM q 11-13 weeks for 1 year. Reviewed when to call clinic for irregular bleeding. - medroxyPROGESTERone (DEPO-PROVERA) injection 150 mg  4. Gynecologic exam normal Counseled that she should get a call or letter re:  Pap results in 2-3 weeks once results are back. - IGP, Aptima HPV     Return in about 11 weeks (around 08/17/2019) for Depo.  No future appointments.  08/19/2019, PA

## 2019-06-12 LAB — IGP, APTIMA HPV
HPV Aptima: POSITIVE — AB
PAP Smear Comment: 0

## 2019-07-21 ENCOUNTER — Encounter: Payer: Self-pay | Admitting: Physician Assistant

## 2019-07-21 NOTE — Progress Notes (Signed)
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  Rec that patient be referred to colpo.

## 2019-07-24 NOTE — Progress Notes (Signed)
TC to patient. Discussed pap results and recommended follow up. Strongly encouraged colpo and patient declines at this time.  Patient states she "feels fine" and "why fix something if it's not broke".  Educated patient re: why we do cervical screening and importance to do follow up and colpo as early intervention to prevent cervical cancer.  Patient declines colpo appt. Instructed to call RN back if she changes her mind.  RN will send Mychart message to patient with RN contact info. Aileen Fass, RN

## 2019-07-25 ENCOUNTER — Encounter: Payer: Self-pay | Admitting: Physician Assistant

## 2019-07-25 NOTE — Progress Notes (Signed)
Reviewed RN note re: discussion with patient about pap results and recommendation to proceed to colpo.  Patient declines colpo at this time and was encouraged to call back if she changes her mind.  In this case, would recommend repeating cotest pap in 1 year if patient does not have colpo done.

## 2019-07-29 NOTE — Progress Notes (Signed)
Patient added to pap f/u log for pap screening in 1year. Aileen Fass, RN

## 2019-08-25 ENCOUNTER — Ambulatory Visit (LOCAL_COMMUNITY_HEALTH_CENTER): Payer: Medicaid Other | Admitting: Family Medicine

## 2019-08-25 ENCOUNTER — Other Ambulatory Visit: Payer: Self-pay

## 2019-08-25 VITALS — BP 112/80 | Ht 67.0 in | Wt 191.2 lb

## 2019-08-25 DIAGNOSIS — Z30013 Encounter for initial prescription of injectable contraceptive: Secondary | ICD-10-CM | POA: Diagnosis not present

## 2019-08-25 DIAGNOSIS — Z3042 Encounter for surveillance of injectable contraceptive: Secondary | ICD-10-CM

## 2019-08-25 DIAGNOSIS — Z113 Encounter for screening for infections with a predominantly sexual mode of transmission: Secondary | ICD-10-CM

## 2019-08-25 DIAGNOSIS — Z3009 Encounter for other general counseling and advice on contraception: Secondary | ICD-10-CM

## 2019-08-25 LAB — WET PREP FOR TRICH, YEAST, CLUE
Trichomonas Exam: NEGATIVE
Yeast Exam: NEGATIVE

## 2019-08-25 MED ORDER — THERA VITAL M PO TABS: 1.0000 | ORAL_TABLET | Freq: Every day | ORAL | 3 refills | Status: AC

## 2019-08-25 MED ORDER — MEDROXYPROGESTERONE ACETATE 150 MG/ML IM SUSP
150.0000 mg | Freq: Once | INTRAMUSCULAR | Status: AC
  Administered 2019-08-25: 150 mg via INTRAMUSCULAR

## 2019-08-25 NOTE — Progress Notes (Signed)
In for Depo and STD screening-denies s/s Sharlette Dense, RN

## 2019-08-25 NOTE — Progress Notes (Signed)
Family Planning Visit  Subjective:  Yvette Jimenez is a 39 y.o. being seen today for  Chief Complaint  Patient presents with  . Contraception    Pt has Bipolar depression ([place]) and Asthma on their problem list.  HPI  Patient reports she is here for Depo, has been using depo x13 yrs. Also would like STI screening, denies symptoms.    No LMP recorded. Patient has had an injection.   Patient reports 3 partner(s) in last year. Do they desire STI screening (if no, why not)? yes  Does the patient desire a pregnancy in the next year? no   39 y.o., Body mass index is 29.95 kg/m. - Is patient eligible for HA1C diabetes screening based on BMI and age >43?  no  Does the patient have a current or past history of drug use? yes No components found for: HCV  See flowsheet for other program required questions.   Health Maintenance Due  Topic Date Due  . TETANUS/TDAP  06/13/1999    ROS  The following portions of the patient's history were reviewed and updated as appropriate: allergies, current medications, past family history, past medical history, past social history, past surgical history and problem list. Problem list updated.  Objective:  BP 112/80   Ht 5\' 7"  (1.702 m)   Wt 191 lb 3.2 oz (86.7 kg)   BMI 29.95 kg/m    Physical Exam  Vitals and nursing note reviewed.  Constitutional:      Appearance: Normal appearance.  HENT:     Head: Normocephalic and atraumatic.     Mouth/Throat:     Mouth: Mucous membranes are moist.     Pharynx: Oropharynx is clear. No oropharyngeal exudate or posterior oropharyngeal erythema.  Pulmonary:     Effort: Pulmonary effort is normal.  Abdominal:     General: Abdomen is flat.     Palpations: There is no mass.     Tenderness: There is no abdominal tenderness. There is no rebound.  Genitourinary:    Comments: Declines pelvic exam, prefers to self collect. Lymphadenopathy:     Head:     Right side of head: No preauricular or posterior  auricular adenopathy.     Left side of head: No preauricular or posterior auricular adenopathy.     Cervical: No cervical adenopathy.     Upper Body:     Right upper body: No supraclavicular or axillary adenopathy.     Left upper body: No supraclavicular or axillary adenopathy.  Skin:    General: Skin is warm and dry.     Findings: No rash.  Neurological:     Mental Status: She is alert and oriented to person, place, and time.      Assessment and Plan:  Yvette Jimenez is a 38 y.o. female presenting to the [facility] Department for a well woman exam/family planning visit  Contraception counseling: Reviewed all forms of birth control options in the tiered based approach including abstinence; over the counter/barrier methods; hormonal contraceptive medication including pill, patch, ring, injection, contraceptive implant; hormonal and nonhormonal IUDs; permanent sterilization options including vasectomy and the various tubal sterilization modalities. Risks, benefits, how to discontinue and typical effectiveness rates were reviewed.  Questions were answered.  Written information was also given to the patient to review.  Patient desires depo, this was prescribed for patient. She will follow up in  3 months for surveillance.  She was told to call with any further questions, or with any concerns  about this method of contraception.  Emphasized use of condoms 100% of the time for STI prevention.  Emergency Contraception: N/a - continuous use of depo   1. Encounter for surveillance of injectable contraceptive -Rx depo x1 yr. Counseling as above. -Risks of Depo use >2 yrs discussed with pt. Alternative BCM discussed, pt would like to continue Depo. Suggested calcium (1200 mg daily) and vitamin D (800 IU daily) supplements, weight bearing exercise and avoiding cigarette smoking and excessive alcohol consumption to promote bone health.  -Follow up in 3 months for repeat Depo Provera injection.   - medroxyPROGESTERone (DEPO-PROVERA) injection 150 mg  2. Screening examination for venereal disease -Screenings today as below. Treat wet prep per standing order. -Patient does meet criteria for HepB, HepC Screening. Declines these screenings. -Counseled on warning s/sx and when to seek care. Recommended condom use with all sex and discussed importance of condom use for STI prevention. - WET PREP FOR TRICH, YEAST, CLUE - Chlamydia/Gonorrhea [place] [facility] - HIV [place] [facility] - Syphilis Serology, [facility]   Return in about 3 months (around 11/23/2019) for Depo.  No future appointments.  Ann Held, PA-C

## 2019-11-17 ENCOUNTER — Other Ambulatory Visit: Payer: Self-pay

## 2019-11-17 ENCOUNTER — Ambulatory Visit (LOCAL_COMMUNITY_HEALTH_CENTER): Payer: Medicaid Other

## 2019-11-17 VITALS — BP 98/68 | Ht 67.0 in | Wt 192.0 lb

## 2019-11-17 DIAGNOSIS — Z3042 Encounter for surveillance of injectable contraceptive: Secondary | ICD-10-CM

## 2019-11-17 DIAGNOSIS — Z3009 Encounter for other general counseling and advice on contraception: Secondary | ICD-10-CM

## 2019-11-17 DIAGNOSIS — Z30013 Encounter for initial prescription of injectable contraceptive: Secondary | ICD-10-CM

## 2019-11-17 MED ORDER — MEDROXYPROGESTERONE ACETATE 150 MG/ML IM SUSP
150.0000 mg | Freq: Once | INTRAMUSCULAR | Status: AC
  Administered 2019-11-17: 150 mg via INTRAMUSCULAR

## 2019-11-17 NOTE — Progress Notes (Signed)
12.0 weeks post depo today.  DMPA 150 mg IM administered today per Samara Snide, PA order dated 08/25/2019.

## 2020-01-29 ENCOUNTER — Encounter: Payer: Self-pay | Admitting: Emergency Medicine

## 2020-01-29 ENCOUNTER — Emergency Department
Admission: EM | Admit: 2020-01-29 | Discharge: 2020-01-29 | Disposition: A | Discharge status: Home / Self Care | Payer: Self-pay | Attending: Emergency Medicine | Admitting: Emergency Medicine

## 2020-01-29 ENCOUNTER — Other Ambulatory Visit: Payer: Self-pay

## 2020-01-29 DIAGNOSIS — S0181XD Laceration without foreign body of other part of head, subsequent encounter: Secondary | ICD-10-CM | POA: Insufficient documentation

## 2020-01-29 DIAGNOSIS — J45909 Unspecified asthma, uncomplicated: Secondary | ICD-10-CM | POA: Insufficient documentation

## 2020-01-29 DIAGNOSIS — Z91040 Latex allergy status: Secondary | ICD-10-CM | POA: Insufficient documentation

## 2020-01-29 DIAGNOSIS — X58XXXD Exposure to other specified factors, subsequent encounter: Secondary | ICD-10-CM | POA: Insufficient documentation

## 2020-01-29 DIAGNOSIS — Z4802 Encounter for removal of sutures: Secondary | ICD-10-CM

## 2020-01-29 MED ORDER — IBUPROFEN 600 MG PO TABS: 600.0000 mg | ORAL_TABLET | Freq: Three times a day (TID) | ORAL | 0 refills | Status: AC | PRN

## 2020-01-29 MED ORDER — TRAMADOL HCL 50 MG PO TABS: 50.0000 mg | ORAL_TABLET | Freq: Four times a day (QID) | ORAL | 0 refills | Status: AC | PRN

## 2020-01-29 NOTE — ED Triage Notes (Signed)
Patient presents to the ED with suture removal from hand injury

## 2020-01-29 NOTE — ED Provider Notes (Signed)
[facility] Emergency Department Provider Note  Patient____________________________________________   First MD Initiated Contact with Patient 01/29/20 [phone]     (approximate)  I have reviewed the triage vital signs and the nursing notes.   HISTORY  Chief Complaint Suture / Staple Removal    HPI Yvette Jimenez is a 39 y.o. female patient presents for suture removal secondary to a laceration to the right lateral forehead.  Patient complain of pain and requesting numbing medicine before sutures are removed.         Past Medical History:  Diagnosis Date   Eczema     Patient Active Problem List   Diagnosis Date Noted   Bipolar depression ([place]) 08/10/2011   Asthma 05/12/2006    Past Surgical History:  Procedure Laterality Date   HERNIA REPAIR      Prior to Admission medications   Medication Sig Start Date End Date Taking? Authorizing Provider  ibuprofen (ADVIL) 600 MG tablet Take 1 tablet (600 mg total) by mouth every 8 (eight) hours as needed. 01/29/20   Sable Feil, PA-C  traMADol (ULTRAM) 50 MG tablet Take 1 tablet (50 mg total) by mouth every 6 (six) hours as needed for up to 3 days. 01/29/20 02/01/20  Sable Feil, PA-C    Allergies Levaquin [levofloxacin] and Latex  Family History  Problem Relation Age of Onset   Heart defect Mother    Bipolar disorder Mother    Diabetes Mother    Hypertension Mother    Diabetes Maternal Grandmother    Lung cancer Maternal Grandfather    Cancer Paternal Grandfather     Social History Social History   Tobacco Use   Smoking status: Never Smoker   Smokeless tobacco: Never Used  Vaping Use   Vaping Use: Former  Substance Use Topics   Alcohol use: Yes    Alcohol/week: 2.0 standard drinks    Types: 2 Shots of liquor per week   Drug use: Yes    Frequency: 3.0 times per week    Types: Marijuana    Review of Systems  Constitutional: No fever/chills Eyes: No visual  changes. ENT: No sore throat. Cardiovascular: Denies chest pain. Respiratory: Denies shortness of breath. Gastrointestinal: No abdominal pain.  No nausea, no vomiting.  No diarrhea.  No constipation. Genitourinary: Negative for dysuria. Musculoskeletal: Negative for back pain. Skin: Negative for rash. Neurological: Negative for headaches, focal weakness or numbness. Psychiatric:  Bipolar Endocrine:  Diabetes and hypertension Allergic/Immunilogical: Levaquin and latex ____________________________________________   PHYSICAL EXAM:  VITAL SIGNS: ED Triage Vitals  Enc Vitals Group     BP 01/29/20 0831 117/75     Pulse Rate 01/29/20 0831 78     Resp 01/29/20 0831 16     Temp 01/29/20 0831 98.6 F (37 C)     Temp src --      SpO2 01/29/20 0831 100 %     Weight 01/29/20 0831 180 lb (81.6 kg)     Height 01/29/20 0831 5\' 7"  (1.702 m)     Head Circumference --      Peak Flow --      Pain Score 01/29/20 0828 0     Pain Loc --      Pain Edu? --      Excl. in [place]? --     Constitutional: Alert and oriented.  Anxious  Cardiovascular: Normal rate, regular rhythm. Grossly normal heart sounds.  Good peripheral circulation. Respiratory: Normal respiratory effort.  No retractions. Lungs  CTAB. Neurologic:  Normal speech and language. No gross focal neurologic deficits are appreciated. No gait instability. Skin:  Skin is warm, dry and intact. No rash noted. Psychiatric: Mood and affect are normal. Speech and behavior are normal.  ____________________________________________   LABS (all labs ordered are listed, but only abnormal results are displayed)  Labs Reviewed - No data to display ____________________________________________  EKG   ____________________________________________  RADIOLOGY  ED MD interpretation:    Official radiology report(s): No results found.  ____________________________________________   PROCEDURES  Procedure(s) performed (including Critical  Care):  .Suture Removal  Date/Time: 01/29/2020 9:22 AM Performed by: Joni Reining, PA-C Authorized by: Joni Reining, PA-C   Consent:    Consent obtained:  Verbal   Consent given by:  Patient   Risks discussed:  Bleeding, pain and wound separation Location:    Location:  Head/neck   Head/neck location:  Forehead Procedure details:    Wound appearance:  No signs of infection, good wound healing and clean   Number of sutures removed:  3 Post-procedure details:    Patient tolerance of procedure:  Tolerated well, no immediate complications    ____________________________________________   INITIAL IMPRESSION / ASSESSMENT AND PLAN / ED COURSE  As part of my medical decision making, I reviewed the following data within the electronic MEDICAL RECORD NUMBER     Patient presents for suture removal right lateral forehead.  See procedure note.  Patient given discharge care instruction advised take medication as directed.    Yvette Jimenez was evaluated in Emergency Department on 01/29/2020 for the symptoms described in the history of present illness. She was evaluated in the context of the global COVID-19 pandemic, which necessitated consideration that the patient might be at risk for infection with the SARS-CoV-2 virus that causes COVID-19. Institutional protocols and algorithms that pertain to the evaluation of patients at risk for COVID-19 are in a state of rapid change based on information released by regulatory bodies including the CDC and federal and state organizations. These policies and algorithms were followed during the patient's care in the ED.       ____________________________________________   FINAL CLINICAL IMPRESSION(S) / ED DIAGNOSES  Final diagnoses:  Encounter for removal of sutures     ED Discharge Orders         Ordered    traMADol (ULTRAM) 50 MG tablet  Every 6 hours PRN     Discontinue  Reprint     01/29/20 0918    ibuprofen (ADVIL) 600 MG tablet  Every 8  hours PRN     Discontinue  Reprint     01/29/20 2841           Note:  This document was prepared using Dragon voice recognition software and may include unintentional dictation errors.    Joni Reining, PA-C 01/29/20 3244    Arnaldo Natal, MD 01/29/20 1027

## 2020-01-29 NOTE — ED Notes (Addendum)
See triage note  Presents for suture removal to head  Healing well

## 2020-02-20 ENCOUNTER — Telehealth: Payer: Self-pay | Admitting: General Practice

## 2020-02-20 NOTE — Telephone Encounter (Signed)
Individual has been contacted 3+ times regarding ED referral. No further attempts to contact individual will be made. 

## 2020-05-07 ENCOUNTER — Telehealth: Payer: Self-pay | Admitting: Licensed Clinical Social Worker

## 2020-05-07 NOTE — Telephone Encounter (Signed)
Patient left vm inquiring about counseling services.

## 2020-05-13 ENCOUNTER — Telehealth: Payer: Self-pay | Admitting: Licensed Clinical Social Worker

## 2020-05-13 NOTE — Telephone Encounter (Signed)
05/10/20- pt. Left vm for LCSW.

## 2020-05-14 ENCOUNTER — Ambulatory Visit: Payer: Medicaid Other

## 2020-05-21 ENCOUNTER — Telehealth: Payer: Self-pay | Admitting: Licensed Clinical Social Worker

## 2020-05-21 NOTE — Telephone Encounter (Signed)
LCSW called to remind patient about the need to sign consent prior to virtual appt. LCSW will follow up with patient and reschedule if consent is not signed.

## 2020-05-29 ENCOUNTER — Ambulatory Visit: Payer: Medicaid Other | Admitting: Licensed Clinical Social Worker

## 2020-06-11 ENCOUNTER — Other Ambulatory Visit: Payer: Self-pay

## 2020-06-11 ENCOUNTER — Ambulatory Visit (LOCAL_COMMUNITY_HEALTH_CENTER): Payer: Medicaid Other | Admitting: Physician Assistant

## 2020-06-11 VITALS — BP 109/71 | Ht 67.0 in | Wt 169.0 lb

## 2020-06-11 DIAGNOSIS — Z3009 Encounter for other general counseling and advice on contraception: Secondary | ICD-10-CM | POA: Diagnosis not present

## 2020-06-11 DIAGNOSIS — Z3042 Encounter for surveillance of injectable contraceptive: Secondary | ICD-10-CM

## 2020-06-11 MED ORDER — MEDROXYPROGESTERONE ACETATE 150 MG/ML IM SUSP
150.0000 mg | Freq: Once | INTRAMUSCULAR | Status: AC
  Administered 2020-06-11: 150 mg via INTRAMUSCULAR

## 2020-06-11 MED ORDER — MEDROXYPROGESTERONE ACETATE 150 MG/ML IM SUSP: 150.0000 mg | INTRAMUSCULAR | 0 refills | Status: DC

## 2020-06-11 NOTE — Progress Notes (Signed)
Patient here for depo. Last depo was 11/17/2019. Patient missed last depo. Last sex was 21-2 weeks ago per patient without condom. Patient does not have periods. Just occasional spotting. Harvie Heck, RN   Post:  Depo 150 mg IM admin in right arm . Patient tolerated well. Depo reminder card given.   Harvie Heck, RN

## 2020-06-12 NOTE — Progress Notes (Signed)
   [facility]  Subjective:  Yvette Jimenez is a 39 y.o. being seen today to restart Depo.  Chief Complaint  Patient presents with  . Contraception    restart depo    HPI  Patient states that she is here to restart Depo.  States that she last had sex about 2.5 weeks ago and she declines pregnancy test today since she has done one at home.  Per chart review, patient is due for RP when next Depo is due in January.   Does the patient have a current or past history of drug use? No   No components found for: HCV]   Health Maintenance Due  Topic Date Due  . Hepatitis C Screening  Never done  . TETANUS/TDAP  Never done  . INFLUENZA VACCINE  03/03/2020    Review of Systems  All other systems reviewed and are negative.   The following portions of the patient's history were reviewed and updated as appropriate: allergies, current medications, past family history, past medical history, past social history, past surgical history and problem list. Problem list updated.   See flowsheet for other program required questions.  Objective:   Vitals:   06/11/20 0917  BP: 109/71  Weight: 169 lb (76.7 kg)  Height: 5\' 7"  (1.702 m)    Physical Exam Vitals and nursing note reviewed.  Constitutional:      General: She is not in acute distress.    Appearance: Normal appearance. She is normal weight.  HENT:     Head: Normocephalic and atraumatic.  Eyes:     Conjunctiva/sclera: Conjunctivae normal.  Pulmonary:     Effort: Pulmonary effort is normal.  Skin:    General: Skin is warm and dry.  Neurological:     Mental Status: She is alert and oriented to person, place, and time.  Psychiatric:        Mood and Affect: Mood normal.        Behavior: Behavior normal.        Thought Content: Thought content normal.        Judgment: Judgment normal.       Assessment and Plan:  Yvette Jimenez is a 39 y.o. female presenting to  the [facility] Department for a Women's Health problem visit  1. Encounter for counseling regarding contraception Patient into clinic to restart Depo.  Declines pregnancy test today. Rec condoms with all sex for 2 weeks and check pregnancy test in 2 weeks. Counseled patient that she will be due for RP when next depo is due and that we will also repeat her pap at that visit.  2. Surveillance for Depo-Provera contraception OK for Depo today per 08/2019 order. - medroxyPROGESTERone (DEPO-PROVERA) injection 150 mg     Return in about 11 weeks (around 08/27/2020) for depo due date.  No future appointments.  08/29/2020, PA

## 2020-07-28 ENCOUNTER — Other Ambulatory Visit: Payer: Self-pay

## 2020-07-28 ENCOUNTER — Emergency Department
Admission: EM | Admit: 2020-07-28 | Discharge: 2020-07-28 | Disposition: A | Discharge status: Home / Self Care | Payer: HRSA Program | Attending: Emergency Medicine | Admitting: Emergency Medicine

## 2020-07-28 ENCOUNTER — Encounter: Payer: Self-pay | Admitting: Emergency Medicine

## 2020-07-28 DIAGNOSIS — U071 COVID-19: Secondary | ICD-10-CM | POA: Diagnosis not present

## 2020-07-28 DIAGNOSIS — Z5321 Procedure and treatment not carried out due to patient leaving prior to being seen by health care provider: Secondary | ICD-10-CM | POA: Diagnosis not present

## 2020-07-28 DIAGNOSIS — R059 Cough, unspecified: Secondary | ICD-10-CM | POA: Diagnosis present

## 2020-07-28 LAB — RESP PANEL BY RT-PCR (FLU A&B, COVID) ARPGX2
Influenza A by PCR: NEGATIVE
Influenza B by PCR: NEGATIVE
SARS Coronavirus 2 by RT PCR: POSITIVE — AB

## 2020-07-28 NOTE — ED Notes (Signed)
Date and time results received: 07/28/20 2005   Test: COVID Critical Value: POSITIVE Name of Provider Notified:   Orders Received? Or Actions Taken?: called pt with results.

## 2020-07-28 NOTE — ED Triage Notes (Signed)
Pt reported she was leaving and would look for her covid results

## 2020-07-28 NOTE — ED Triage Notes (Signed)
Pt ambulatory to triage without difficulty with c/o cough, bodyaches, fever and generally not feeling well for last 2 days.  Pt reports fever at home of 102.  States she last took ibuprofen around 9 or 10 this am.  Pt reports close contact with someone that has tested positive.

## 2020-07-29 ENCOUNTER — Other Ambulatory Visit (HOSPITAL_COMMUNITY): Payer: Self-pay | Admitting: Nurse Practitioner

## 2020-07-29 ENCOUNTER — Encounter: Payer: Self-pay | Admitting: Nurse Practitioner

## 2020-07-29 DIAGNOSIS — U071 COVID-19: Secondary | ICD-10-CM

## 2020-07-29 NOTE — Progress Notes (Signed)
I connected by phone with Elder Cyphers on 07/29/2020 at 11:01 AM to discuss the potential use of a treatment for mild to moderate COVID-19 viral infection in non-hospitalized patients.  This patient is a 39 y.o. female that meets the FDA criteria for Emergency Use Authorization of bamlanivimab/etesevimab, casirivimab\imdevimab, or sotrovimab  Has a (+) direct SARS-CoV-2 viral test result  Has mild or moderate COVID-19   Is ? 40 years of age and weighs ? 40 kg  Is NOT hospitalized due to COVID-19  Is NOT requiring oxygen therapy or requiring an increase in baseline oxygen flow rate due to COVID-19  Is within 10 days of symptom onset  Has at least one of the high risk factor(s) for progression to severe COVID-19 and/or hospitalization as defined in EUA.  Specific high risk criteria : Other high risk medical condition per CDC:  Social Vulnerability Index 4   Onset 12/25. Unvaccinated.    I have spoken and communicated the following to the patient or parent/caregiver:  1. FDA has authorized the emergency use of bamlanivimab/etesevimab, casirivimab\imdevimab, or sotrovimab for the treatment of mild to moderate COVID-19 in adults and pediatric patients with positive results of direct SARS-CoV-2 viral testing who are 33 years of age and older weighing at least 40 kg, and who are at high risk for progressing to severe COVID-19 and/or hospitalization.  2. The significant known and potential risks and benefits of bamlanivimab/etesevimab, casirivimab\imdevimab, or sotrovimab, and the extent to which such potential risks and benefits are unknown.  3. Information on available alternative treatments and the risks and benefits of those alternatives, including clinical trials.  4. Patients treated with bamlanivimab/etesevimab, casirivimab\imdevimab, or sotrovimab should continue to self-isolate and use infection control measures (e.g., wear mask, isolate, social distance, avoid sharing personal  items, clean and disinfect "high touch" surfaces, and frequent handwashing) according to CDC guidelines.   5. The patient or parent/caregiver has the option to accept or refuse bamlanivimab/etesevimab, casirivimab\imdevimab, or sotrovimab.  After reviewing this information with the patient, the patient has agreed to receive one of the available covid 19 monoclonal antibodies and will be provided an appropriate fact sheet prior to infusion.Consuello Masse, DNP, AGNP-C [phone] (Infusion Center Hotline)

## 2020-07-30 ENCOUNTER — Ambulatory Visit (HOSPITAL_COMMUNITY)
Admission: RE | Admit: 2020-07-30 | Discharge: 2020-07-30 | Disposition: A | Discharge status: Home / Self Care | Payer: HRSA Program | Source: Ambulatory Visit | Attending: Pulmonary Disease | Admitting: Pulmonary Disease

## 2020-07-30 DIAGNOSIS — U071 COVID-19: Secondary | ICD-10-CM | POA: Diagnosis present

## 2020-07-30 MED ORDER — DIPHENHYDRAMINE HCL 50 MG/ML IJ SOLN: 50.0000 mg | Freq: Once | INTRAMUSCULAR | Status: DC | PRN

## 2020-07-30 MED ORDER — ACETAMINOPHEN 325 MG PO TABS
650.0000 mg | ORAL_TABLET | Freq: Four times a day (QID) | ORAL | Status: DC | PRN
  Administered 2020-07-30: 650 mg via ORAL
  Filled 2020-07-30: qty 2

## 2020-07-30 MED ORDER — EPINEPHRINE 0.3 MG/0.3ML IJ SOAJ: 0.3000 mg | Freq: Once | INTRAMUSCULAR | Status: DC | PRN

## 2020-07-30 MED ORDER — METHYLPREDNISOLONE SODIUM SUCC 125 MG IJ SOLR: 125.0000 mg | Freq: Once | INTRAMUSCULAR | Status: DC | PRN

## 2020-07-30 MED ORDER — SODIUM CHLORIDE 0.9 % IV SOLN: INTRAVENOUS | Status: DC | PRN

## 2020-07-30 MED ORDER — FAMOTIDINE IN NACL 20-0.9 MG/50ML-% IV SOLN: 20.0000 mg | Freq: Once | INTRAVENOUS | Status: DC | PRN

## 2020-07-30 MED ORDER — SODIUM CHLORIDE 0.9 % IV SOLN: Freq: Once | INTRAVENOUS | Status: AC

## 2020-07-30 MED ORDER — SODIUM CHLORIDE 0.9 % IV BOLUS
1000.0000 mL | Freq: Once | INTRAVENOUS | Status: AC
  Administered 2020-07-30: 1000 mL via INTRAVENOUS

## 2020-07-30 MED ORDER — ALBUTEROL SULFATE HFA 108 (90 BASE) MCG/ACT IN AERS: 2.0000 | INHALATION_SPRAY | Freq: Once | RESPIRATORY_TRACT | Status: DC | PRN

## 2020-07-30 NOTE — Progress Notes (Signed)
Patient reviewed Fact Sheet for Patients, Parents, and Caregivers for Emergency Use Authorization (EUA) of bamlanivimab and etesevimab for the Treatment of Coronavirus. Patient also reviewed and is agreeable to the estimated cost of treatment. Patient is agreeable to proceed.   

## 2020-07-30 NOTE — Progress Notes (Signed)
  Diagnosis: COVID-19  Physician:Dr. Delford Field  Procedure: Covid Infusion Clinic Med: casirivimab\imdevimab infusion - Provided patient with casirivimab\imdevimab fact sheet for patients, parents and caregivers prior to infusion. Pt was given NS bolus d/t weakness, insufficient oral intake, tolerated the procedure.   Complications: No immediate complications noted.  Discharge: Discharged home . Per patient she felt better.  Robb Matar 07/30/2020

## 2020-07-30 NOTE — Discharge Instructions (Signed)
10 Things You Can Do to Manage Your COVID-19 Symptoms at Home If you have possible or confirmed COVID-19: 1. Stay home from work and school. And stay away from other public places. If you must go out, avoid using any kind of public transportation, ridesharing, or taxis. 2. Monitor your symptoms carefully. If your symptoms get worse, call your healthcare provider immediately. 3. Get rest and stay hydrated. 4. If you have a medical appointment, call the healthcare provider ahead of time and tell them that you have or may have COVID-19. 5. For medical emergencies, call 911 and notify the dispatch personnel that you have or may have COVID-19. 6. Cover your cough and sneezes with a tissue or use the inside of your elbow. 7. Wash your hands often with soap and water for at least 20 seconds or clean your hands with an alcohol-based hand sanitizer that contains at least 60% alcohol. 8. As much as possible, stay in a specific room and away from other people in your home. Also, you should use a separate bathroom, if available. If you need to be around other people in or outside of the home, wear a mask. 9. Avoid sharing personal items with other people in your household, like dishes, towels, and bedding. 10. Clean all surfaces that are touched often, like counters, tabletops, and doorknobs. Use household cleaning sprays or wipes according to the label instructions. cdc.gov/coronavirus 02/01/2019 This information is not intended to replace advice given to you by your health care provider. Make sure you discuss any questions you have with your health care provider. Document Revised: 07/06/2019 Document Reviewed: 07/06/2019 Elsevier Patient Education  2020 Elsevier Inc. What types of side effects do monoclonal antibody drugs cause?  Common side effects  In general, the more common side effects caused by monoclonal antibody drugs include: . Allergic reactions, such as hives or itching . Flu-like signs and  symptoms, including chills, fatigue, fever, and muscle aches and pains . Nausea, vomiting . Diarrhea . Skin rashes . Low blood pressure   The CDC is recommending patients who receive monoclonal antibody treatments wait at least 90 days before being vaccinated.  Currently, there are no data on the safety and efficacy of mRNA COVID-19 vaccines in persons who received monoclonal antibodies or convalescent plasma as part of COVID-19 treatment. Based on the estimated half-life of such therapies as well as evidence suggesting that reinfection is uncommon in the 90 days after initial infection, vaccination should be deferred for at least 90 days, as a precautionary measure until additional information becomes available, to avoid interference of the antibody treatment with vaccine-induced immune responses. If you have any questions or concerns after the infusion please call the Advanced Practice Provider on call at [phone]. This number is ONLY intended for your use regarding questions or concerns about the infusion post-treatment side-effects.  Please do not provide this number to others for use. For return to work notes please contact your primary care provider.   If someone you know is interested in receiving treatment please have them call the COVID hotline at [phone].   

## 2020-08-20 ENCOUNTER — Telehealth: Payer: Self-pay | Admitting: Family Medicine

## 2020-08-20 NOTE — Telephone Encounter (Signed)
Pt is due for Depo 1/25 but states she wants to get it early because she is bleeding. Pt states that she has been given her Depo early in the past for this reason.

## 2020-08-20 NOTE — Telephone Encounter (Signed)
Call to patient. Patient requesting her depo 1 week early, last depo 06/11/2020. Patient reports heavy bleeding. RN counseled patients on side effects of early depo. Patient verbalizes understanding but still wants depo early. Patient scheduled for 08/21/2020.  Harvie Heck, RN

## 2020-08-21 ENCOUNTER — Ambulatory Visit (LOCAL_COMMUNITY_HEALTH_CENTER): Payer: Medicaid Other

## 2020-08-21 ENCOUNTER — Other Ambulatory Visit: Payer: Self-pay

## 2020-08-21 VITALS — BP 96/65 | Ht 67.0 in | Wt 179.5 lb

## 2020-08-21 DIAGNOSIS — Z3009 Encounter for other general counseling and advice on contraception: Secondary | ICD-10-CM | POA: Diagnosis not present

## 2020-08-21 DIAGNOSIS — Z30013 Encounter for initial prescription of injectable contraceptive: Secondary | ICD-10-CM | POA: Diagnosis not present

## 2020-08-21 MED ORDER — MEDROXYPROGESTERONE ACETATE 150 MG/ML IM SUSP
150.0000 mg | Freq: Once | INTRAMUSCULAR | Status: AC
Start: 2020-08-21 — End: 2020-08-21
  Administered 2020-08-21: 150 mg via INTRAMUSCULAR

## 2020-08-21 NOTE — Progress Notes (Signed)
Patient is 10 w 1 d post last Depo today, but requested to be seen early due to a trip and no menses while on her trip. Hart Carwin, RN  Patient reports a menses that started on Monday 08/12/2020.  She thinks it was due to donating at the plasma center and the IV meds they give to prevent clotting?  She normally just spots once in a while.  She does report doing a couple of home UPT's and they were negative.   DMPA 150 mg given IM (Left Deltoid) today per 08/25/2019 order by Talmadge Chad  And tolerated well.  Patient aware of scheduling a physical next appointment and Depo will be given at that appointment. Hart Carwin, RN

## 2020-10-02 ENCOUNTER — Telehealth: Payer: Medicaid Other | Admitting: Nurse Practitioner

## 2020-10-02 ENCOUNTER — Telehealth: Payer: Medicaid Other | Admitting: Family Medicine

## 2020-10-02 DIAGNOSIS — R21 Rash and other nonspecific skin eruption: Secondary | ICD-10-CM

## 2020-10-02 NOTE — Progress Notes (Signed)
Based on what you shared with me it looks like you have a skin condition,that should be evaluated in a face to face office visit. I cannot make a proper diagnosis based on your answers and your picture sent. You will need a face to face visit for proper diagnosis and treatment.    NOTE: If you entered your credit card information for this eVisit, you will not be charged. You may see a "hold" on your card for the $35 but that hold will drop off and you will not have a charge processed.  If you are having a true medical emergency please call 911.     For an urgent face to face visit, [place] has four urgent care centers for your convenience:   . [facility]    [phone]                  Get Driving Directions  [address] [place], [place] [address] . 10 am to 8 pm Monday-Friday . 12 pm to 8 pm Saturday-Sunday   . [facility] at [facility]  [phone]                  Get Driving Directions  [address] [place], [place] [address] . 8 am to 8 pm Monday-Friday . 9 am to 6 pm Saturday . 11 am to 6 pm Sunday   . [facility] at [facility]  [phone]                  Get Driving Directions   [address] [place], [place] [address] . 8 am to 8 pm Monday-Friday . 8 am to 4 pm Saturday-Sunday    . [facility] at [facility] Directions  [phone]  [address], [address] [place], [place] [address]  . Monday-Friday, 12 PM to 6 PM    Your e-visit answers were reviewed by a board certified advanced clinical practitioner to complete your personal care plan.  Thank you for using e-Visits.

## 2020-10-03 ENCOUNTER — Telehealth: Payer: Medicaid Other

## 2020-10-03 ENCOUNTER — Other Ambulatory Visit: Payer: Self-pay

## 2020-10-03 ENCOUNTER — Telehealth: Payer: Self-pay

## 2020-10-03 ENCOUNTER — Ambulatory Visit
Admission: RE | Admit: 2020-10-03 | Discharge: 2020-10-03 | Disposition: A | Discharge status: Home / Self Care | Payer: Medicaid Other | Source: Ambulatory Visit | Attending: Family Medicine | Admitting: Family Medicine

## 2020-10-03 VITALS — BP 118/85 | HR 86 | Temp 98.2°F | Resp 18 | Ht 67.0 in | Wt 175.0 lb

## 2020-10-03 DIAGNOSIS — L659 Nonscarring hair loss, unspecified: Secondary | ICD-10-CM

## 2020-10-03 MED ORDER — CLOBETASOL PROPIONATE 0.05 % EX OINT: 1.0000 "application " | TOPICAL_OINTMENT | Freq: Every day | CUTANEOUS | 1 refills | Status: AC

## 2020-10-03 MED ORDER — BETAMETHASONE VALERATE 0.1 % EX OINT
1.0000 | TOPICAL_OINTMENT | Freq: Every day | CUTANEOUS | 2 refills | Status: AC
Start: 2020-10-03 — End: ?

## 2020-10-03 MED ORDER — CLOBETASOL PROPIONATE 0.05 % EX OINT: 1.0000 "application " | TOPICAL_OINTMENT | Freq: Every day | CUTANEOUS | 1 refills | Status: DC

## 2020-10-03 NOTE — ED Provider Notes (Signed)
[facility]    CSN: [id number] Arrival date & time: 10/03/20  1046      History   Chief Complaint Chief Complaint  Patient presents with  . Alopecia   HPI  40 year old female presents with hair loss.  Patient states that she used to have dreadlocks.  She states that after her dreadlocks were removed she had a small area of hair loss.  This was approximately 1 year ago.  She states over the past few months that her loss area has gotten worse.  It is quite large.  She is having no pain.  No itching.  Patient states that she has had this previously and resolved without treatment.  She is quite concerned and would like to be examined and treated today.  No other complaints at this time.  Past Medical History:  Diagnosis Date  . Eczema    Patient Active Problem List   Diagnosis Date Noted  . Bipolar depression (HCC) 08/10/2011  . Asthma 05/12/2006   Past Surgical History:  Procedure Laterality Date  . HERNIA REPAIR      OB History   No obstetric history on file.      Home Medications    Prior to Admission medications   Medication Sig Start Date End Date Taking? Authorizing Provider  betamethasone valerate ointment (VALISONE) 0.1 % Apply 1 application topically daily. 10/03/20  Yes Veleka Djordjevic G, DO  ibuprofen (ADVIL) 600 MG tablet Take 1 tablet (600 mg total) by mouth every 8 (eight) hours as needed. 01/29/20  Yes Joni Reining, PA-C  Multiple Vitamin (MULTIVITAMIN) capsule Take 1 capsule by mouth daily.   Yes [provider]  clobetasol ointment (TEMOVATE) 0.05 % Apply 1 application topically daily. 10/03/20   Tommie Sams, DO    Family History Family History  Problem Relation Age of Onset  . Heart defect Mother   . Bipolar disorder Mother   . Diabetes Mother   . Hypertension Mother   . Diabetes Maternal Grandmother   . Lung cancer Maternal Grandfather   . Cancer Paternal Grandfather     Social History Social History   Tobacco Use  .  Smoking status: Never Smoker  . Smokeless tobacco: Never Used  Vaping Use  . Vaping Use: Former  Substance Use Topics  . Alcohol use: Yes    Alcohol/week: 2.0 standard drinks    Types: 2 Shots of liquor per week  . Drug use: Yes    Frequency: 3.0 times per week    Types: Marijuana     Allergies   Levaquin [levofloxacin] and Latex   Review of Systems Review of Systems  Constitutional: Negative.   Skin:       Hair loss.   Physical Exam Triage Vital Signs ED Triage Vitals  Enc Vitals Group     BP 10/03/20 1115 118/85     Pulse Rate 10/03/20 1115 86     Resp 10/03/20 1115 18     Temp 10/03/20 1115 98.2 F (36.8 C)     Temp Source 10/03/20 1115 Oral     SpO2 10/03/20 1115 98 %     Weight 10/03/20 1113 175 lb (79.4 kg)     Height 10/03/20 1113 5\' 7"  (1.702 m)     Head Circumference --      Peak Flow --      Pain Score 10/03/20 1113 0     Pain Loc --      Pain Edu? --  Excl. in GC? --    Updated Vital Signs BP 118/85 (BP Location: Left Arm)   Pulse 86   Temp 98.2 F (36.8 C) (Oral)   Resp 18   Ht 5\' 7"  (1.702 m)   Wt 79.4 kg   SpO2 98%   BMI 27.41 kg/m   Visual Acuity Right Eye Distance:   Left Eye Distance:   Bilateral Distance:    Right Eye Near:   Left Eye Near:    Bilateral Near:     Physical Exam Vitals and nursing note reviewed.  Constitutional:      General: She is not in acute distress.    Appearance: Normal appearance. She is not ill-appearing.  HENT:     Head:     Comments: Large area of hair loss on the parietal scalp.  No scale or crusting.  No erythema. Eyes:     General:        Right eye: No discharge.        Left eye: No discharge.     Conjunctiva/sclera: Conjunctivae normal.  Pulmonary:     Effort: Pulmonary effort is normal. No respiratory distress.  Neurological:     Mental Status: She is alert.  Psychiatric:        Mood and Affect: Mood normal.        Behavior: Behavior normal.    UC Treatments / Results   Labs (all labs ordered are listed, but only abnormal results are displayed) Labs Reviewed - No data to display  EKG   Radiology No results found.  Procedures Procedures (including critical care time)  Medications Ordered in UC Medications - No data to display  Initial Impression / Assessment and Plan / UC Course  I have reviewed the triage vital signs and the nursing notes.  Pertinent labs & imaging results that were available during my care of the patient were reviewed by me and considered in my medical decision making (see chart for details).    40 year old female presents with alopecia.  This is a chronic issue.  I have advised that she needs to see dermatology for intralesional corticoid steroid injections.  In the meantime, I am placing her on high potency topical steroids which has evidence of benefit in the setting of alopecia.  Rx for clobetasol was sent.   **This medication was subsequently not covered by her insurance.  I sent in an additional medication and this was not covered either.  Nursing staff is giving her a good Rx card and sending to another pharmacy so that she may obtain the medication.  Final Clinical Impressions(s) / UC Diagnoses   Final diagnoses:  Alopecia     Discharge Instructions     Medication daily (apply to area and 1 cm beyond).  See Dermatology [facility] at [facility] [address] [place], [place] Main [phone]   ED Prescriptions    Medication Sig Dispense Auth. Provider   clobetasol ointment (TEMOVATE) 0.05 % Apply 1 application topically daily. 60 g Coree Brame G, DO   betamethasone valerate ointment (VALISONE) 0.1 % Apply 1 application topically daily. 30 g 10[birth year]1983, DO     PDMP not reviewed this encounter.   Tommie Sams, Tommie Sams 10/03/20 1248

## 2020-10-03 NOTE — ED Triage Notes (Signed)
Patient states she started having a bald spot forming after dreadlocks. Patient states that some her hair grew back but now she is having a bigger area that is having hair loss.

## 2020-10-03 NOTE — Discharge Instructions (Signed)
Medication daily (apply to area and 1 cm beyond).  See Dermatology [facility] at [facility] [address] [place], [place] [address] Main [phone]

## 2020-10-04 ENCOUNTER — Ambulatory Visit: Payer: Medicaid Other | Admitting: Family Medicine

## 2020-11-11 ENCOUNTER — Ambulatory Visit (LOCAL_COMMUNITY_HEALTH_CENTER): Payer: Medicaid Other | Admitting: Family Medicine

## 2020-11-11 ENCOUNTER — Ambulatory Visit: Payer: Medicaid Other

## 2020-11-11 ENCOUNTER — Other Ambulatory Visit: Payer: Self-pay

## 2020-11-11 VITALS — BP 120/72 | Ht 67.0 in | Wt 187.4 lb

## 2020-11-11 DIAGNOSIS — Z1231 Encounter for screening mammogram for malignant neoplasm of breast: Secondary | ICD-10-CM

## 2020-11-11 DIAGNOSIS — Z3009 Encounter for other general counseling and advice on contraception: Secondary | ICD-10-CM | POA: Diagnosis not present

## 2020-11-11 DIAGNOSIS — Z01419 Encounter for gynecological examination (general) (routine) without abnormal findings: Secondary | ICD-10-CM | POA: Diagnosis not present

## 2020-11-11 DIAGNOSIS — R87629 Unspecified abnormal cytological findings in specimens from vagina: Secondary | ICD-10-CM

## 2020-11-11 DIAGNOSIS — Z3042 Encounter for surveillance of injectable contraceptive: Secondary | ICD-10-CM | POA: Diagnosis not present

## 2020-11-11 MED ORDER — MEDROXYPROGESTERONE ACETATE 150 MG/ML IM SUSP
150.0000 mg | INTRAMUSCULAR | Status: AC
  Administered 2020-11-11 – 2021-04-18 (×3): 150 mg via INTRAMUSCULAR

## 2020-11-11 NOTE — Progress Notes (Signed)
[facility] DEPARTMENT [facility] [address] Main Number: [phone]   Family Planning Visit- Initial Visit  Subjective:  Yvette Jimenez is a 40 y.o.  G2P2   being seen today for an initial annual visit and to discuss contraceptive options.  The patient is currently using Depo Provera for pregnancy prevention. Patient reports she does not want a pregnancy in the next year.  Patient has the following medical conditions has Bipolar depression (HCC); Asthma; and Abnormal Pap smear of vagina on their problem list.  Chief Complaint  Patient presents with  . Annual Exam    PE    Patient reports she likes depo because it takes away her cycle. She is happy with the method.   Patient denies changes to medical history. Denies known breast cancer.   Body mass index is 29.35 kg/m. - Patient is eligible for diabetes screening based on BMI and age >41?  no HA1C ordered? no  Patient reports 1  partner/s in last year. Desires STI screening?  Yes  Has patient been screened once for HCV in the past?  No  No results found for: HCVAB  Does the patient have current drug use (including MJ), have a partner with drug use, and/or has been incarcerated since last result? No  If yes-- Screen for HCV through [facility]   Does the patient meet criteria for HBV testing? No  Criteria:  -Household, sexual or needle sharing contact with HBV -History of drug use -HIV positive -Those with known Hep C   Health Maintenance Due  Topic Date Due  . Hepatitis C Screening  Never done  . COVID-19 Vaccine (1) Never done  . TETANUS/TDAP  Never done  . PAP SMEAR-Modifier  05/31/2020    Review of Systems  Constitutional: Negative for chills and fever.  Eyes: Negative for blurred vision and double vision.  Respiratory: Negative for cough and shortness of breath.   Cardiovascular: Negative for chest pain and orthopnea.  Gastrointestinal: Negative for nausea and  vomiting.  Genitourinary: Negative for dysuria, flank pain and frequency.  Musculoskeletal: Negative for myalgias.  Skin: Negative for rash.  Neurological: Negative for dizziness, tingling, weakness and headaches.  Endo/Heme/Allergies: Does not bruise/bleed easily.  Psychiatric/Behavioral: Negative for depression and suicidal ideas. The patient is not nervous/anxious.     The following portions of the patient's history were reviewed and updated as appropriate: allergies, current medications, past family history, past medical history, past social history, past surgical history and problem list. Problem list updated.   See flowsheet for other program required questions.  Objective:   Vitals:   11/11/20 1350  BP: 120/72  Weight: 187 lb 6.4 oz (85 kg)  Height: 5\' 7"  (1.702 m)    Physical Exam Constitutional:      Appearance: Normal appearance.  HENT:     Head: Normocephalic and atraumatic.  Pulmonary:     Effort: Pulmonary effort is normal.  Abdominal:     Palpations: Abdomen is soft.  Genitourinary:    General: Normal vulva.     Exam position: Lithotomy position.     Pubic Area: No rash or pubic lice.      Labia:        Right: No rash or tenderness.        Left: No rash or tenderness.      Vagina: Normal.     Cervix: Normal.     Uterus: Normal.      Adnexa: Left adnexa  normal.       Right: Tenderness present. Fullness: mild over prior hernia repair site, chronic per patient and not severe.   Musculoskeletal:        General: Normal range of motion.  Skin:    General: Skin is warm and dry.  Neurological:     General: No focal deficit present.     Mental Status: She is alert.  Psychiatric:        Mood and Affect: Mood normal.        Behavior: Behavior normal.       Assessment and Plan:  Yvette Jimenez is a 39 y.o. female presenting to the [facility] Department for an initial annual wellness/contraceptive visit  Contraception counseling: Reviewed all  forms of birth control options in the tiered based approach. available including abstinence; over the counter/barrier methods; hormonal contraceptive medication including pill, patch, ring, injection,contraceptive implant, ECP; hormonal and nonhormonal IUDs; permanent sterilization options including vasectomy and the various tubal sterilization modalities. Risks, benefits, and typical effectiveness rates were reviewed.  Questions were answered.  Written information was also given to the patient to review.  Patient desires Depo, this was prescribed for patient. She will follow up in  12month  for surveillance.  She was told to call with any further questions, or with any concerns about this method of contraception.  Emphasized use of condoms 100% of the time for STI prevention.  1. Abnormal Pap smear of vagina Discussed her abnormal result in 2021 and recommendation for colpo. She reports she had a bx in the past. I reviewed the recommendations for further evaluation. I discussed the cervical cancer is preventable if found early and treated and the role of HPV in cervical cancer.  She accepts a pap today and says she would follow up for colpo if abnormal.  - IGP, Aptima HPV  2. Well woman exam with routine gynecological exam - Breast Exam benign today, accepts referral for mammogram. Low risk and is OK to wait until 49 if desired.  - IGP, Aptima HPV - Chlamydia/Gonorrhea [place] [facility] - HIV [place] [facility] - Syphilis Serology, [facility]  3. Encounter for surveillance of injectable contraceptive - Depo x 1 year per order - medroxyPROGESTERone (DEPO-PROVERA) injection 150 mg  Return in about 3 months (around 02/10/2021) for Depo.  No future appointments.  Federico Flake, MD

## 2020-11-11 NOTE — Progress Notes (Signed)
Depo Provera 150 mg given IM in Rt deltoid. Pt c/o slight burning with the medication.  No other complictions

## 2020-11-14 LAB — IGP, APTIMA HPV
HPV Aptima: POSITIVE — AB
PAP Smear Comment: 0

## 2020-11-14 LAB — HM HIV SCREENING LAB: HM HIV Screening: NEGATIVE

## 2020-11-19 ENCOUNTER — Telehealth: Payer: Self-pay | Admitting: Family Medicine

## 2020-11-19 NOTE — Telephone Encounter (Signed)
-----   Message from Kimberly Niles Newton, MD sent at 11/11/2020  5:15 PM EDT ----- I sent in a order for a mammogram for this patient. Can you follow up and see if it is scheduled? It is routine screening mammogram  

## 2020-11-19 NOTE — Telephone Encounter (Signed)
-----   Message from Federico Flake, MD sent at 11/11/2020  5:15 PM EDT ----- I sent in a order for a mammogram for this patient. Can you follow up and see if it is scheduled? It is routine screening mammogram

## 2021-01-27 ENCOUNTER — Other Ambulatory Visit: Payer: Self-pay

## 2021-01-27 ENCOUNTER — Ambulatory Visit (LOCAL_COMMUNITY_HEALTH_CENTER): Payer: Medicaid Other

## 2021-01-27 VITALS — BP 110/77 | Ht 67.0 in | Wt 185.0 lb

## 2021-01-27 DIAGNOSIS — Z3042 Encounter for surveillance of injectable contraceptive: Secondary | ICD-10-CM | POA: Diagnosis not present

## 2021-01-27 DIAGNOSIS — Z3009 Encounter for other general counseling and advice on contraception: Secondary | ICD-10-CM | POA: Diagnosis not present

## 2021-01-27 NOTE — Progress Notes (Signed)
11 weeks post depo. Voices no concerns. Depo given today per order by Ralene Bathe MD dated 11/11/2020. Tolerated well L delt. Next depo due 04/14/2021, pt aware. Jerel Shepherd, RN

## 2021-02-04 ENCOUNTER — Telehealth: Payer: Self-pay | Admitting: Nurse Practitioner

## 2021-02-04 NOTE — Telephone Encounter (Signed)
Telephone call to patient regarding Colpo referral.  Colpo referral made with [facility] on 11/22/20.  [facility] contacted regarding appointment.  [facility] stated that there were multiple attempts made to schedule Colpo appointment, but no answer.  Patient notified of telephone calls and encouraged to call and schedule an appointment.  Patient given [facility] telephone number.  Will follow up to see if appointment scheduled.  Glenna Fellows, RN

## 2021-02-07 NOTE — Telephone Encounter (Signed)
Note opened in error. Glenna Fellows, RN

## 2021-03-08 ENCOUNTER — Other Ambulatory Visit: Payer: Self-pay

## 2021-03-08 ENCOUNTER — Encounter: Payer: Self-pay | Admitting: Emergency Medicine

## 2021-03-08 ENCOUNTER — Ambulatory Visit
Admission: EM | Admit: 2021-03-08 | Discharge: 2021-03-08 | Disposition: A | Discharge status: Home / Self Care | Payer: Medicaid Other | Attending: Sports Medicine | Admitting: Sports Medicine

## 2021-03-08 DIAGNOSIS — J069 Acute upper respiratory infection, unspecified: Secondary | ICD-10-CM | POA: Diagnosis not present

## 2021-03-08 DIAGNOSIS — B349 Viral infection, unspecified: Secondary | ICD-10-CM

## 2021-03-08 DIAGNOSIS — R519 Headache, unspecified: Secondary | ICD-10-CM | POA: Diagnosis present

## 2021-03-08 DIAGNOSIS — U071 COVID-19: Secondary | ICD-10-CM | POA: Diagnosis not present

## 2021-03-08 DIAGNOSIS — R52 Pain, unspecified: Secondary | ICD-10-CM

## 2021-03-08 DIAGNOSIS — R059 Cough, unspecified: Secondary | ICD-10-CM | POA: Diagnosis not present

## 2021-03-08 DIAGNOSIS — R0981 Nasal congestion: Secondary | ICD-10-CM | POA: Diagnosis not present

## 2021-03-08 DIAGNOSIS — R6883 Chills (without fever): Secondary | ICD-10-CM | POA: Diagnosis present

## 2021-03-08 MED ORDER — PROMETHAZINE-DM 6.25-15 MG/5ML PO SYRP: 5.0000 mL | ORAL_SOLUTION | Freq: Four times a day (QID) | ORAL | 0 refills | Status: AC | PRN

## 2021-03-08 MED ORDER — FLUTICASONE PROPIONATE 50 MCG/ACT NA SUSP: 2.0000 | Freq: Every day | NASAL | 0 refills | Status: AC

## 2021-03-08 MED ORDER — ALBUTEROL SULFATE HFA 108 (90 BASE) MCG/ACT IN AERS: 1.0000 | INHALATION_SPRAY | Freq: Four times a day (QID) | RESPIRATORY_TRACT | 0 refills | Status: AC | PRN

## 2021-03-08 NOTE — Discharge Instructions (Addendum)
As we discussed, your COVID test is pending.  Please follow along on the app on your phone called MyChart. I prescribed a cough medicine, and a nasal steroid.  I renewed your albuterol.  No indication for antibiotic at this time. Please isolate until you know the results of your COVID test.  If it is negative then you can return to regular activity and society.  If it is positive you will need to quarantine per the current CDC guidelines. Please see educational handouts. Plenty of rest, plenty of fluids, Tylenol or Motrin for any fever or discomfort. Since you do not have a primary care physician if your symptoms do not improve please seek out care at any of the local urgent cares. If your symptoms were to worsen in any way please go to the ER.

## 2021-03-08 NOTE — ED Triage Notes (Signed)
Patient c/o cough, congestion, headache, fatigue and chest pain when she coughs that started Thursday.  Patient reports chills.

## 2021-03-08 NOTE — ED Provider Notes (Signed)
[facility]    CSN: [id number] Arrival date & time: 03/08/21  7829      History   Chief Complaint Chief Complaint  Patient presents with   Chest Pain   Cough    HPI Yvette Jimenez is a 40 y.o. female.   40 year old female presents for evaluation of 3 days of URI symptoms.  She is complaining of chest congestion, chest tightness, burning sensation when she coughs, chills, fatigue, body aches, and headache without vision changes or neurological complaints.  She does not have a primary care provider.  She goes to the health department every 3 months to get her Depo-Provera shots.  She works as a Leisure centre manager.  She does say that she has a history of asthma and she has an albuterol inhaler but she does not have that on her medication list and she says that it has expired.  She has had some chills but no fever.  No nausea vomiting or diarrhea.  No abdominal or urinary symptoms.  She did a home COVID test a few days ago that was negative.  She has not been vaccinated.  She is also complaining of a lot of nasal congestion and postnasal drip.  No chest pain or shortness of breath or any cardiac concerns.  Remainder of review of systems as documented in the review of systems but is otherwise negative except for what is listed in the HPI.  No red flag signs or symptoms elicited on history.   Past Medical History:  Diagnosis Date   Eczema     Patient Active Problem List   Diagnosis Date Noted   Abnormal Pap smear of vagina 11/11/2020   Bipolar depression (HCC) 08/10/2011   Asthma 05/12/2006    Past Surgical History:  Procedure Laterality Date   HERNIA REPAIR      OB History     Gravida  2   Para  2   Term      Preterm      AB      Living  2      SAB      IAB      Ectopic      Multiple      Live Births               Home Medications    Prior to Admission medications   Medication Sig Start Date End Date Taking? Authorizing Provider  albuterol  (VENTOLIN HFA) 108 (90 Base) MCG/ACT inhaler Inhale 1-2 puffs into the lungs every 6 (six) hours as needed for wheezing or shortness of breath. 03/08/21  Yes Delton See, MD  fluticasone [facility]) 50 MCG/ACT nasal spray Place 2 sprays into both nostrils daily. 03/08/21  Yes Delton See, MD  Multiple Vitamin (MULTIVITAMIN) capsule Take 1 capsule by mouth daily.   Yes [provider]  promethazine-dextromethorphan (PROMETHAZINE-DM) 6.25-15 MG/5ML syrup Take 5 mLs by mouth 4 (four) times daily as needed for cough. 03/08/21  Yes Delton See, MD  betamethasone valerate ointment (VALISONE) 0.1 % Apply 1 application topically daily. Patient not taking: Reported on 11/11/2020 10/03/20   Tommie Sams, DO  clobetasol ointment (TEMOVATE) 0.05 % Apply 1 application topically daily. Patient not taking: Reported on 11/11/2020 10/03/20   Tommie Sams, DO  ibuprofen (ADVIL) 600 MG tablet Take 1 tablet (600 mg total) by mouth every 8 (eight) hours as needed. 01/29/20   Joni Reining, PA-C    Family History Family History  Problem Relation  Age of Onset   Heart defect Mother    Bipolar disorder Mother    Diabetes Mother    Hypertension Mother    Diabetes Maternal Grandmother    Lung cancer Maternal Grandfather    Cancer Paternal Grandfather     Social History Social History   Tobacco Use   Smoking status: Never   Smokeless tobacco: Never  Vaping Use   Vaping Use: Every day  Substance Use Topics   Alcohol use: Yes    Alcohol/week: 2.0 standard drinks    Types: 2 Shots of liquor per week   Drug use: Yes    Frequency: 3.0 times per week    Types: Marijuana     Allergies   Levaquin [levofloxacin] and Latex   Review of Systems Review of Systems  Constitutional:  Positive for chills and fatigue. Negative for activity change, appetite change, diaphoresis and fever.  HENT:  Positive for congestion and postnasal drip. Negative for ear discharge, ear pain, rhinorrhea, sinus pressure,  sinus pain, sneezing and sore throat.   Eyes:  Negative for pain.  Respiratory:  Positive for cough and chest tightness. Negative for wheezing.   Cardiovascular:  Negative for chest pain and palpitations.  Gastrointestinal:  Negative for abdominal pain, diarrhea, nausea and vomiting.  Genitourinary:  Negative for dysuria.  Musculoskeletal:  Positive for myalgias. Negative for back pain and neck pain.  Skin:  Negative for color change, pallor, rash and wound.  Neurological:  Positive for headaches. Negative for dizziness, syncope, light-headedness and numbness.  All other systems reviewed and are negative.   Physical Exam Triage Vital Signs ED Triage Vitals  Enc Vitals Group     BP 03/08/21 1034 129/86     Pulse Rate 03/08/21 1034 97     Resp 03/08/21 1034 16     Temp 03/08/21 1034 98.7 F (37.1 C)     Temp Source 03/08/21 1034 Oral     SpO2 03/08/21 1034 100 %     Weight 03/08/21 1030 180 lb (81.6 kg)     Height 03/08/21 1030 5\' 7"  (1.702 m)     Head Circumference --      Peak Flow --      Pain Score 03/08/21 1030 7     Pain Loc --      Pain Edu? --      Excl. in GC? --    No data found.  Updated Vital Signs BP 129/86 (BP Location: Left Arm)   Pulse 97   Temp 98.7 F (37.1 C) (Oral)   Resp 16   Ht 5\' 7"  (1.702 m)   Wt 81.6 kg   SpO2 100%   BMI 28.19 kg/m   Visual Acuity Right Eye Distance:   Left Eye Distance:   Bilateral Distance:    Right Eye Near:   Left Eye Near:    Bilateral Near:     Physical Exam Vitals and nursing note reviewed.  Constitutional:      General: She is not in acute distress.    Appearance: Normal appearance. She is well-developed. She is not ill-appearing, toxic-appearing or diaphoretic.  HENT:     Head: Normocephalic and atraumatic.     Nose: Congestion present. No rhinorrhea.     Mouth/Throat:     Mouth: Mucous membranes are moist.     Pharynx: No oropharyngeal exudate or posterior oropharyngeal erythema.  Eyes:     General:  No scleral icterus.       Right eye: No  discharge.        Left eye: No discharge.     Extraocular Movements: Extraocular movements intact.     Conjunctiva/sclera: Conjunctivae normal.     Pupils: Pupils are equal, round, and reactive to light.  Neck:     Vascular: No JVD.     Trachea: No tracheal deviation.  Cardiovascular:     Rate and Rhythm: Normal rate and regular rhythm.     Pulses: Normal pulses.          Carotid pulses are 2+ on the right side and 2+ on the left side.      Radial pulses are 2+ on the right side and 2+ on the left side.       Dorsalis pedis pulses are 2+ on the right side and 2+ on the left side.       Posterior tibial pulses are 2+ on the right side and 2+ on the left side.     Heart sounds: Normal heart sounds. No murmur heard.   No friction rub. No gallop.  Pulmonary:     Effort: Pulmonary effort is normal. No respiratory distress.     Breath sounds: Normal breath sounds. No stridor. No decreased breath sounds, wheezing, rhonchi or rales.  Musculoskeletal:     Cervical back: Normal range of motion and neck supple.  Lymphadenopathy:     Cervical: No cervical adenopathy.  Skin:    General: Skin is warm and dry.     Capillary Refill: Capillary refill takes less than 2 seconds.     Findings: No ecchymosis, erythema or rash.  Neurological:     General: No focal deficit present.     Mental Status: She is alert and oriented to person, place, and time.     UC Treatments / Results  Labs (all labs ordered are listed, but only abnormal results are displayed) Labs Reviewed  SARS CORONAVIRUS 2 (TAT 6-24 HRS)    EKG   Radiology No results found.  Procedures Procedures (including critical care time)  Medications Ordered in UC Medications - No data to display  Initial Impression / Assessment and Plan / UC Course  I have reviewed the triage vital signs and the nursing notes.  Pertinent labs & imaging results that were available during my care of the  patient were reviewed by me and considered in my medical decision making (see chart for details).  Clinical impression: 1.  Upper respiratory tract infection possibly COVID.  Certainly viral in nature. 2.  Cough 3.  Nasal congestion 4.  Headache due to viral illness 5.  Body aches 6.  Chills without fever  Treatment plan: 1.  The findings and treatment plan were discussed in detail with the patient.  Patient was in agreement. 2.  Recommended getting a COVID test.  It was pending at the time of discharge.  I asked her to follow along with the app on her phone called MyChart.  She should isolate until she has a negative test result.  If she does have a positive result she will need to quarantine per the current CDC guidelines. 3.  Educational handouts provided. 4.  Plenty of rest, plenty of fluids, Tylenol or Motrin for any fever or discomfort.  She voiced verbal understanding. 5.  I did prescribe a cough medicine and nasal steroid she can get that at the pharmacy.  Also renewed her albuterol she said that she needed a refill. 6.  We discussed a work note, but she said that  they are short staffed.  She would call out if she is positive and obtain a note when we are open in clinic. 7.  If symptoms persist she can come back here or go to another urgent care or go to the health department. 8.  If symptoms worsen she knows to call 911 or go to the nearest emergency room 9.  She was stable upon discharge and will follow-up here as needed.    Final Clinical Impressions(s) / UC Diagnoses   Final diagnoses:  Cough  Viral upper respiratory tract infection  Nasal congestion  Headache due to viral infection  Body aches  Chills (without fever)     Discharge Instructions      As we discussed, your COVID test is pending.  Please follow along on the app on your phone called MyChart. I prescribed a cough medicine, and a nasal steroid.  I renewed your albuterol.  No indication for antibiotic at  this time. Please isolate until you know the results of your COVID test.  If it is negative then you can return to regular activity and society.  If it is positive you will need to quarantine per the current CDC guidelines. Please see educational handouts. Plenty of rest, plenty of fluids, Tylenol or Motrin for any fever or discomfort. Since you do not have a primary care physician if your symptoms do not improve please seek out care at any of the local urgent cares. If your symptoms were to worsen in any way please go to the ER.     ED Prescriptions     Medication Sig Dispense Auth. Provider   albuterol (VENTOLIN HFA) 108 (90 Base) MCG/ACT inhaler Inhale 1-2 puffs into the lungs every 6 (six) hours as needed for wheezing or shortness of breath. 1 each Delton SeeBarnes, Nicle Connole, MD   fluticasone [facility](FLONASE) 50 MCG/ACT nasal spray Place 2 sprays into both nostrils daily. 15.8 mL Delton SeeBarnes, Naheem Mosco, MD   promethazine-dextromethorphan (PROMETHAZINE-DM) 6.25-15 MG/5ML syrup Take 5 mLs by mouth 4 (four) times daily as needed for cough. 180 mL Delton SeeBarnes, Christen Bedoya, MD      PDMP not reviewed this encounter.   Delton SeeBarnes, Kandis Henry, MD 03/08/21 [phone]

## 2021-03-09 LAB — SARS CORONAVIRUS 2 (TAT 6-24 HRS): SARS Coronavirus 2: POSITIVE — AB

## 2021-04-18 ENCOUNTER — Other Ambulatory Visit: Payer: Self-pay

## 2021-04-18 ENCOUNTER — Ambulatory Visit (LOCAL_COMMUNITY_HEALTH_CENTER): Payer: Medicaid Other

## 2021-04-18 VITALS — BP 107/71 | Ht 67.0 in | Wt 182.5 lb

## 2021-04-18 DIAGNOSIS — Z3009 Encounter for other general counseling and advice on contraception: Secondary | ICD-10-CM

## 2021-04-18 DIAGNOSIS — Z3042 Encounter for surveillance of injectable contraceptive: Secondary | ICD-10-CM

## 2021-04-18 DIAGNOSIS — Z30013 Encounter for initial prescription of injectable contraceptive: Secondary | ICD-10-CM

## 2021-04-18 NOTE — Progress Notes (Signed)
11 weeks 4 days post depo. Voices no concerns. Depo given today per order by Ralene Bathe, MD dated 11/11/2020. Tolerated well R delt. Next depo due 07/04/2021, pt aware. Jerel Shepherd, RN

## 2021-07-08 ENCOUNTER — Ambulatory Visit: Payer: Medicaid Other

## 2021-08-29 ENCOUNTER — Emergency Department
Admission: EM | Admit: 2021-08-29 | Discharge: 2021-08-29 | Disposition: A | Discharge status: Home / Self Care | Payer: Self-pay | Attending: Emergency Medicine | Admitting: Emergency Medicine

## 2021-08-29 ENCOUNTER — Other Ambulatory Visit: Payer: Self-pay

## 2021-08-29 ENCOUNTER — Encounter: Payer: Self-pay | Admitting: Emergency Medicine

## 2021-08-29 ENCOUNTER — Emergency Department: Payer: Self-pay

## 2021-08-29 DIAGNOSIS — R0602 Shortness of breath: Secondary | ICD-10-CM | POA: Insufficient documentation

## 2021-08-29 DIAGNOSIS — Z5321 Procedure and treatment not carried out due to patient leaving prior to being seen by health care provider: Secondary | ICD-10-CM | POA: Insufficient documentation

## 2021-08-29 DIAGNOSIS — R110 Nausea: Secondary | ICD-10-CM | POA: Insufficient documentation

## 2021-08-29 HISTORY — DX: Unspecified asthma, uncomplicated: J45.909

## 2021-08-29 LAB — CBC
HCT: 36.9 % (ref 36.0–46.0)
Hemoglobin: 12.4 g/dL (ref 12.0–15.0)
MCH: 30.2 pg (ref 26.0–34.0)
MCHC: 33.6 g/dL (ref 30.0–36.0)
MCV: 90 fL (ref 80.0–100.0)
Platelets: 240 10*3/uL (ref 150–400)
RBC: 4.1 MIL/uL (ref 3.87–5.11)
RDW: 12.3 % (ref 11.5–15.5)
WBC: 6 10*3/uL (ref 4.0–10.5)
nRBC: 0 % (ref 0.0–0.2)

## 2021-08-29 LAB — COMPREHENSIVE METABOLIC PANEL
ALT: 21 U/L (ref 0–44)
AST: 21 U/L (ref 15–41)
Albumin: 3.9 g/dL (ref 3.5–5.0)
Alkaline Phosphatase: 61 U/L (ref 38–126)
Anion gap: 8 (ref 5–15)
BUN: 16 mg/dL (ref 6–20)
CO2: 26 mmol/L (ref 22–32)
Calcium: 9.3 mg/dL (ref 8.9–10.3)
Chloride: 104 mmol/L (ref 98–111)
Creatinine, Ser: 0.91 mg/dL (ref 0.44–1.00)
GFR, Estimated: >60 mL/min (ref >60)
Glucose, Bld: 161 mg/dL — ABNORMAL HIGH (ref 70–99)
Potassium: 3.9 mmol/L (ref 3.5–5.1)
Sodium: 138 mmol/L (ref 135–145)
Total Bilirubin: 0.5 mg/dL (ref 0.3–1.2)
Total Protein: 7.5 g/dL (ref 6.5–8.1)

## 2021-08-29 LAB — TROPONIN I (HIGH SENSITIVITY): Troponin I (High Sensitivity): <2 ng/L (ref <18)

## 2021-08-29 MED ORDER — ALBUTEROL SULFATE HFA 108 (90 BASE) MCG/ACT IN AERS
2.0000 | INHALATION_SPRAY | RESPIRATORY_TRACT | Status: DC | PRN
  Filled 2021-08-29: qty 6.7

## 2021-08-29 NOTE — ED Notes (Signed)
Patient to waiting room via wheelchair by EMS from work.  EMS reports that patient became nauseated after eating and then short of breath.  EMS interventions -- hr 78, bp 149/70, pulse oxi 98% on room air.

## 2021-08-29 NOTE — ED Triage Notes (Signed)
Pt presents to ER with complaints of shortness of breath and nausea. Pt reports she was on her normal state of health before lunch time and reports she ate some egg rolls and started to feel some nausea, reports she went outside and smoked some weed and felt dizzy, reports she started to feel short of breath and does not have her inhaler reports history of asthma. Pt talks in complete sentences no respiratory distress noted .

## 2021-12-03 ENCOUNTER — Ambulatory Visit: Payer: Medicaid Other

## 2021-12-03 NOTE — Progress Notes (Signed)
Patient walked in today requesting routine STI appt.  States "something doesn't feel right in my stomach that feels familiar" and would like to be checked for STI's.  States she "made some bad choices over the weekend and just wants to be checked".  Informed patient of services that can be provided to her with [facility] and the Emergency Department as the first choice before being seen at the health department.  Patient states she has already "taken care of that" and she is "familiar with [facility]" as she has had services with them in the past.  Patient verbalizes understanding of available resources and chooses to be seen with the health department.  Offered next available appt, patient declines, states "something just doesn't feel right with my stomach" and wishes to be worked in today if possible.   ?

## 2021-12-09 ENCOUNTER — Encounter: Payer: Self-pay | Admitting: Family Medicine

## 2021-12-09 ENCOUNTER — Ambulatory Visit: Payer: Medicaid Other | Admitting: Family Medicine

## 2021-12-09 DIAGNOSIS — Z62819 Personal history of unspecified abuse in childhood: Secondary | ICD-10-CM

## 2021-12-09 DIAGNOSIS — Z113 Encounter for screening for infections with a predominantly sexual mode of transmission: Secondary | ICD-10-CM

## 2021-12-09 LAB — WET PREP FOR TRICH, YEAST, CLUE
Trichomonas Exam: NEGATIVE
Yeast Exam: NEGATIVE

## 2021-12-09 LAB — HM HEPATITIS C SCREENING LAB: HM Hepatitis Screen: NEGATIVE

## 2021-12-09 LAB — HM HIV SCREENING LAB: HM HIV Screening: NEGATIVE

## 2021-12-09 LAB — HEPATITIS B SURFACE ANTIGEN: Hepatitis B Surface Ag: NONREACTIVE

## 2021-12-09 NOTE — Progress Notes (Signed)
[facility] Department ? ?STI clinic/screening visit ?[address] ?[place] [address] ?[phone] ? ?Subjective:  ?Yvette Jimenez is a 41 y.o. female being seen today for an STI screening visit. The patient reports they do not have symptoms.  Patient reports that they do not desire a pregnancy in the next year.   They reported they are not interested in discussing contraception today.   ? ?No LMP recorded. Patient has had an injection. ? ? ?Patient has the following medical conditions:   ?Patient Active Problem List  ? Diagnosis Date Noted  ? Abnormal Pap smear of vagina 11/11/2020  ? Bipolar depression (HCC) 08/10/2011  ? Asthma 05/12/2006  ? ? ?Chief Complaint  ?Patient presents with  ? SEXUALLY TRANSMITTED DISEASE  ?  STI screening  ? ? ?HPI ? ?Patient reports she is here for an STD screen.    She denies symptoms ? ?Last HIV test per patient/review of record was 11/14/2020 ?Patient reports last pap was 05/22/2009 ? ?Screening for MPX risk: ?Does the patient have an unexplained rash? No ?Is the patient MSM? No ?Does the patient endorse multiple sex partners or anonymous sex partners? No ?Did the patient have close or sexual contact with a person diagnosed with MPX? No ?Has the patient traveled outside the [place] where MPX is endemic? No ?Is there a high clinical suspicion for MPX-- evidenced by one of the following No ? -Unlikely to be chickenpox ? -Lymphadenopathy ? -Rash that present in same phase of evolution on any given body part ?See flowsheet for further details and programmatic requirements.  ? ?Immunization history:  ?Immunization History  ?Administered Date(s) Administered  ? Td 05/09/2003  ?  ? ?The following portions of the patient's history were reviewed and updated as appropriate: allergies, current medications, past medical history, past social history, past surgical history and problem list. ? ?Objective:  ?There were no vitals filed for this visit. ? ?Physical Exam ?Vitals  and nursing note reviewed.  ?Constitutional:   ?   Appearance: Normal appearance.  ?HENT:  ?   Head: Normocephalic.  ?Pulmonary:  ?   Effort: Pulmonary effort is normal.  ?Genitourinary: ?   General: Normal vulva.  ?   Exam position: Lithotomy position.  ?   Labia:     ?   Right: No rash, tenderness or lesion.     ?   Left: No rash, tenderness or lesion.   ?   Vagina: Normal.  ?   Cervix: Normal.  ?   Uterus: Normal.   ?   Adnexa: Right adnexa normal and left adnexa normal.  ?   Rectum: Normal.  ?Musculoskeletal:     ?   General: Normal range of motion.  ?Lymphadenopathy:  ?   Lower Body: No right inguinal adenopathy. No left inguinal adenopathy.  ?Skin: ?   General: Skin is warm and dry.  ?Neurological:  ?   Mental Status: She is alert. Mental status is at baseline.  ? ? ? ?Assessment and Plan:  ?NAYLEAH BARTKIEWICZ is a 41 y.o. female presenting to the [facility] Department for STI screening ? ?1. Screening examination for venereal disease ? ?Treat wet prep as per SO. ?- WET PREP FOR TRICH, YEAST, CLUE ?- Chlamydia/Gonorrhea [place] ?- HIV/HCV [place] [facility] ?- Hepatitis Serology, [place] [facility] ?- Syphilis Serology, [place] [facility] ? ? ? ? ?No follow-ups on file. ? ?No future appointments. ? ?Larene Pickett, FNP ?

## 2021-12-09 NOTE — Progress Notes (Signed)
Pt here for STI screening. Wet Prep negative and no treatment indicated per SO.  Lethea Killings RN ?

## 2021-12-24 ENCOUNTER — Ambulatory Visit: Payer: Self-pay | Admitting: Advanced Practice Midwife

## 2021-12-24 ENCOUNTER — Encounter: Payer: Self-pay | Admitting: Advanced Practice Midwife

## 2021-12-24 DIAGNOSIS — Z113 Encounter for screening for infections with a predominantly sexual mode of transmission: Secondary | ICD-10-CM

## 2021-12-24 DIAGNOSIS — F1290 Cannabis use, unspecified, uncomplicated: Secondary | ICD-10-CM

## 2021-12-24 DIAGNOSIS — T7421XS Adult sexual abuse, confirmed, sequela: Secondary | ICD-10-CM

## 2021-12-24 DIAGNOSIS — T7421XA Adult sexual abuse, confirmed, initial encounter: Secondary | ICD-10-CM | POA: Insufficient documentation

## 2021-12-24 DIAGNOSIS — Z62810 Personal history of physical and sexual abuse in childhood: Secondary | ICD-10-CM | POA: Insufficient documentation

## 2021-12-24 DIAGNOSIS — R87629 Unspecified abnormal cytological findings in specimens from vagina: Secondary | ICD-10-CM

## 2021-12-24 DIAGNOSIS — T7412XS Child physical abuse, confirmed, sequela: Secondary | ICD-10-CM

## 2021-12-24 DIAGNOSIS — T7412XA Child physical abuse, confirmed, initial encounter: Secondary | ICD-10-CM | POA: Insufficient documentation

## 2021-12-24 LAB — WET PREP FOR TRICH, YEAST, CLUE
Trichomonas Exam: NEGATIVE
Yeast Exam: NEGATIVE

## 2021-12-24 NOTE — Progress Notes (Signed)
[facility] Department  STI clinic/screening visit [address] [place] [address] [phone]  Subjective:  Yvette Jimenez is a 41 y.o. SBF smoker G2P2  female being seen today for an STI screening visit. The patient reports they do not have symptoms.  Patient reports that they do not desire a pregnancy in the next year.   They reported they are not interested in discussing contraception today.    No LMP recorded (lmp unknown). Patient has had an injection.   Patient has the following medical conditions:   Patient Active Problem List   Diagnosis Date Noted   Morbid obesity (HCC) 180 lbs 12/24/2021   Abnormal Pap smear of vagina 11/11/2020   Bipolar depression (HCC) 08/10/2011   Asthma 05/12/2006    Chief Complaint  Patient presents with   SEXUALLY TRANSMITTED DISEASE    Screening    HPI  Patient reports asymptomatic and just wants a check; had irritation last week but resolved now. Pt very vague and poor historian, refusing to answer history questions. Last sex "I don't know, sometime this month" when condom slipped and first time with this partner. LMP none. Last MJ this am. Last ETOH yesterday (3 shots liquor).  Was here for testing 12/09/21  Last HIV test per patient/review of record was 12/09/21 Patient reports last pap was 11/11/20 neg HPV +  Screening for MPX risk: Does the patient have an unexplained rash? No Is the patient MSM? No Does the patient endorse multiple sex partners or anonymous sex partners? No Did the patient have close or sexual contact with a person diagnosed with MPX? No Has the patient traveled outside the [place] where MPX is endemic? No Is there a high clinical suspicion for MPX-- evidenced by one of the following No  -Unlikely to be chickenpox  -Lymphadenopathy  -Rash that present in same phase of evolution on any given body part See flowsheet for further details and programmatic requirements.   Immunization history:   Immunization History  Administered Date(s) Administered   Td 05/09/2003     The following portions of the patient's history were reviewed and updated as appropriate: allergies, current medications, past medical history, past social history, past surgical history and problem list.  Objective:  There were no vitals filed for this visit.  Physical Exam Vitals and nursing note reviewed.  Constitutional:      Appearance: Normal appearance. She is obese.  HENT:     Head: Normocephalic and atraumatic.     Mouth/Throat:     Mouth: Mucous membranes are moist.     Pharynx: Oropharynx is clear. No oropharyngeal exudate or posterior oropharyngeal erythema.  Eyes:     Conjunctiva/sclera: Conjunctivae normal.  Pulmonary:     Effort: Pulmonary effort is normal.  Abdominal:     Palpations: Abdomen is soft. There is no mass.     Tenderness: There is no abdominal tenderness. There is no rebound.     Comments: Poor tone, soft without masses or tenderness  Genitourinary:    General: Normal vulva.     Exam position: Lithotomy position.     Pubic Area: No rash or pubic lice.      Labia:        Right: No rash or lesion.        Left: No rash or lesion.      Vagina: Vaginal discharge (very little white creamy leukorrhea, ph<4.5) present. No erythema, bleeding or lesions.     Cervix: Normal.  Uterus: Normal.      Adnexa: Right adnexa normal and left adnexa normal.     Rectum: Normal.  Lymphadenopathy:     Head:     Right side of head: No preauricular or posterior auricular adenopathy.     Left side of head: No preauricular or posterior auricular adenopathy.     Cervical: No cervical adenopathy.     Right cervical: No superficial, deep or posterior cervical adenopathy.    Left cervical: No superficial, deep or posterior cervical adenopathy.     Upper Body:     Right upper body: No supraclavicular or axillary adenopathy.     Left upper body: No supraclavicular or axillary adenopathy.      Lower Body: No right inguinal adenopathy. No left inguinal adenopathy.  Skin:    General: Skin is warm and dry.     Findings: No rash.  Neurological:     Mental Status: She is alert and oriented to person, place, and time.     Assessment and Plan:  Yvette Jimenez is a 41 y.o. female presenting to the [facility] Department for STI screening  1. Morbid obesity ([place]) 180 lbs   2. Screening examination for venereal disease Treat wet mount per standing orders Immunization nurse consult - WET PREP FOR [place], YEAST, [place] [facility]     No follow-ups on file.  Future Appointments  Date Time Provider [place]  12/31/2021  3:00 PM Milton Ferguson, LCSW [facility] None    Herbie Saxon, CNM

## 2021-12-24 NOTE — Progress Notes (Signed)
Pt here for STI screening.  Wet mount results reviewed, no treatment needed per S.O.  Non-latex condoms provided due to pt with latex allergy.-Collins Scotland, RN

## 2021-12-31 ENCOUNTER — Ambulatory Visit: Payer: Medicaid Other | Admitting: Licensed Clinical Social Worker

## 2022-04-12 ENCOUNTER — Ambulatory Visit
Admission: EM | Admit: 2022-04-12 | Discharge: 2022-04-12 | Disposition: A | Discharge status: Other Acute Inpatient Hospital | Payer: Medicaid Other

## 2022-04-12 DIAGNOSIS — M542 Cervicalgia: Secondary | ICD-10-CM
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
## 2022-04-12 NOTE — Discharge Instructions (Addendum)
Due to the degree of pain that you are having in your neck, and I am unable to complete a full physical exam, I feel you need to have a CT scan of your neck to rule out bony pathology given your mechanism of injury.  Please go to [facility].  Please go now.

## 2022-04-12 NOTE — ED Notes (Signed)
Patient is being discharged from the Urgent Care and sent to the Emergency Department via POV . Per Berneta Sages, NP, patient is in need of higher level of care due to needing further imaging CT scan. Patient is aware and verbalizes understanding of plan of care.  Vitals:   04/12/22 0829  BP: 122/79  Pulse: 97  Temp: 98.2 F (36.8 C)  SpO2: 100%

## 2022-05-20 ENCOUNTER — Ambulatory Visit: Payer: Medicaid Other

## 2023-06-26 IMAGING — CR DG CHEST 2V
2 series · 2 of 2 positions shown · non-contrast
Comparison: 10/13/2012

CLINICAL DATA: Shortness of breath

EXAM:
CHEST - 2 VIEW

[chest pa]
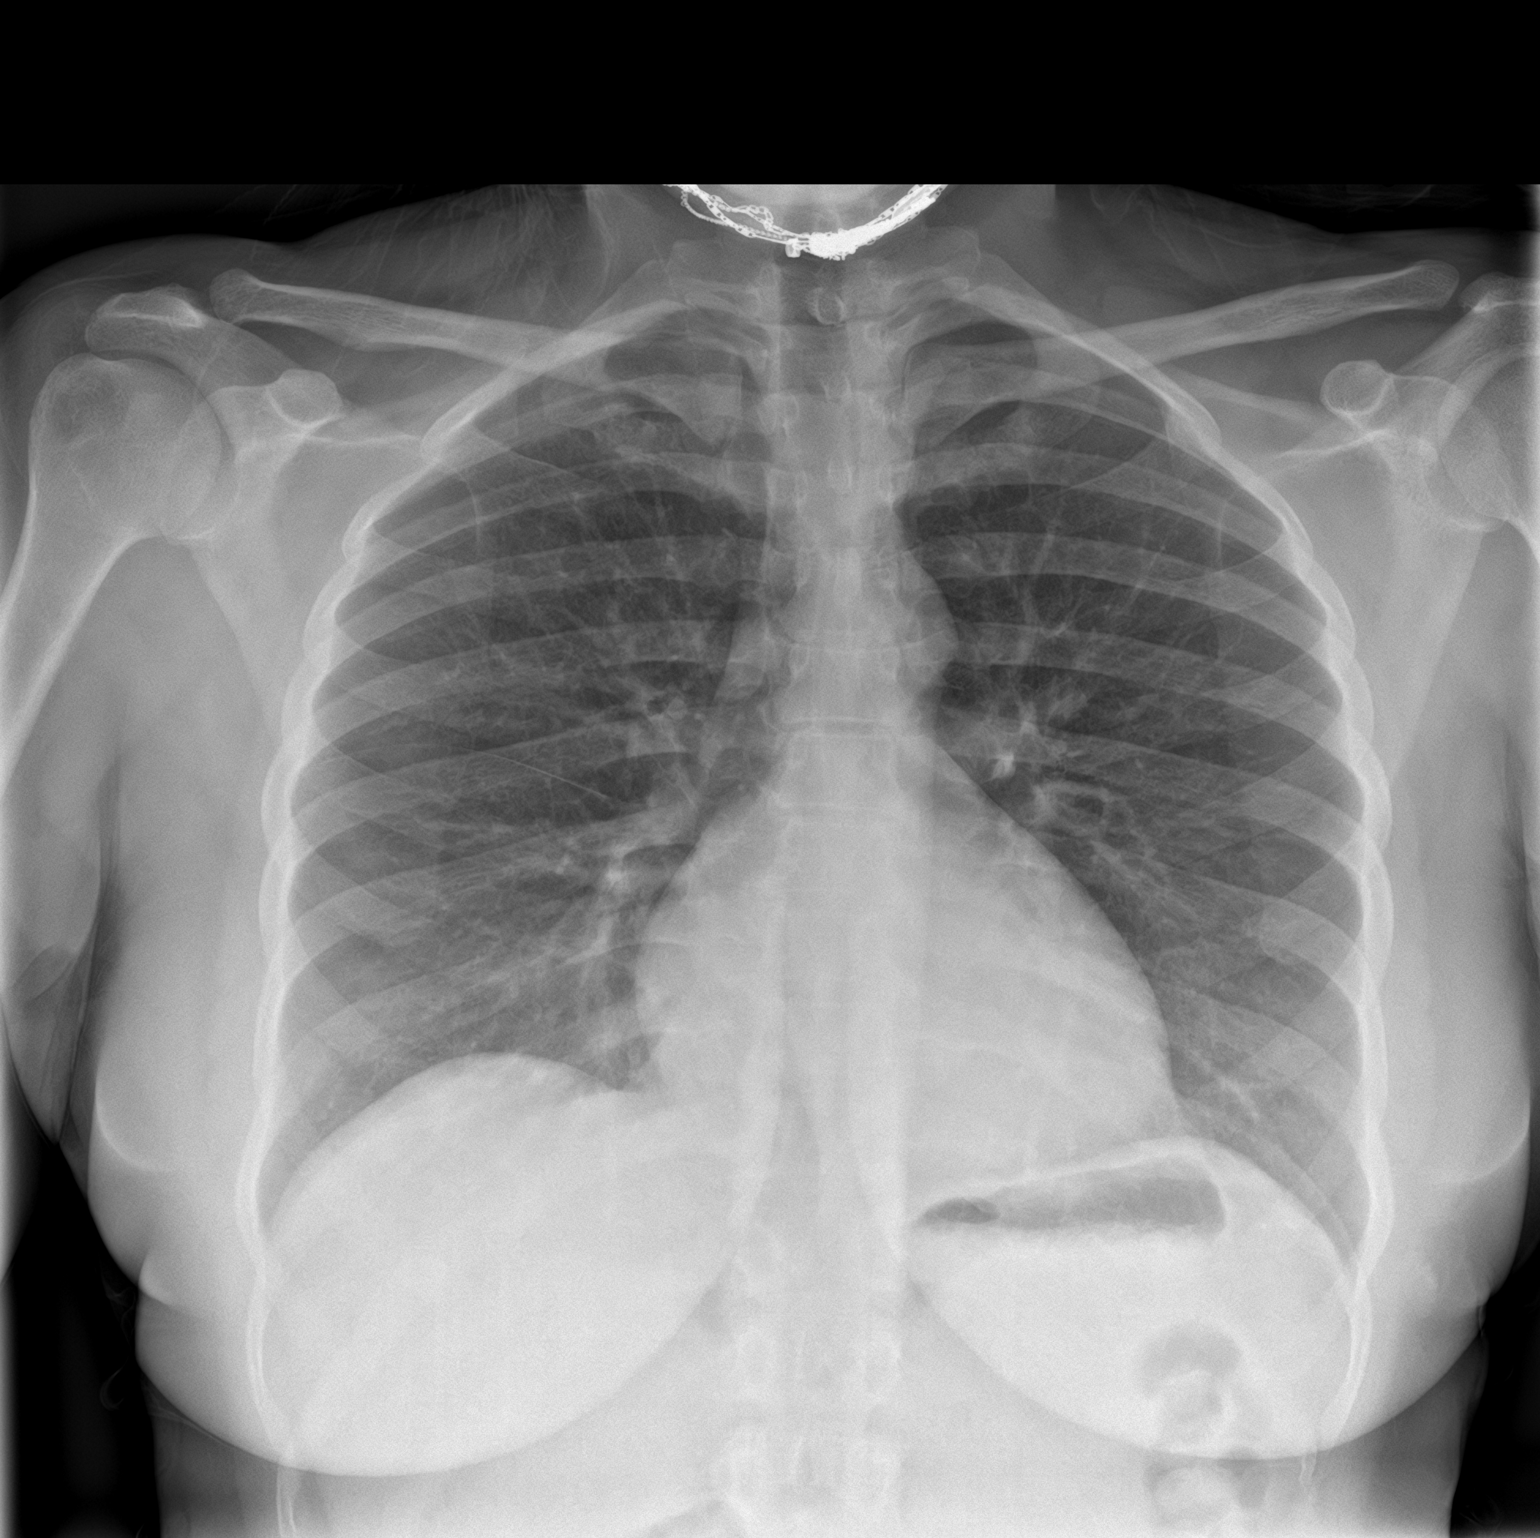

[chest lat]
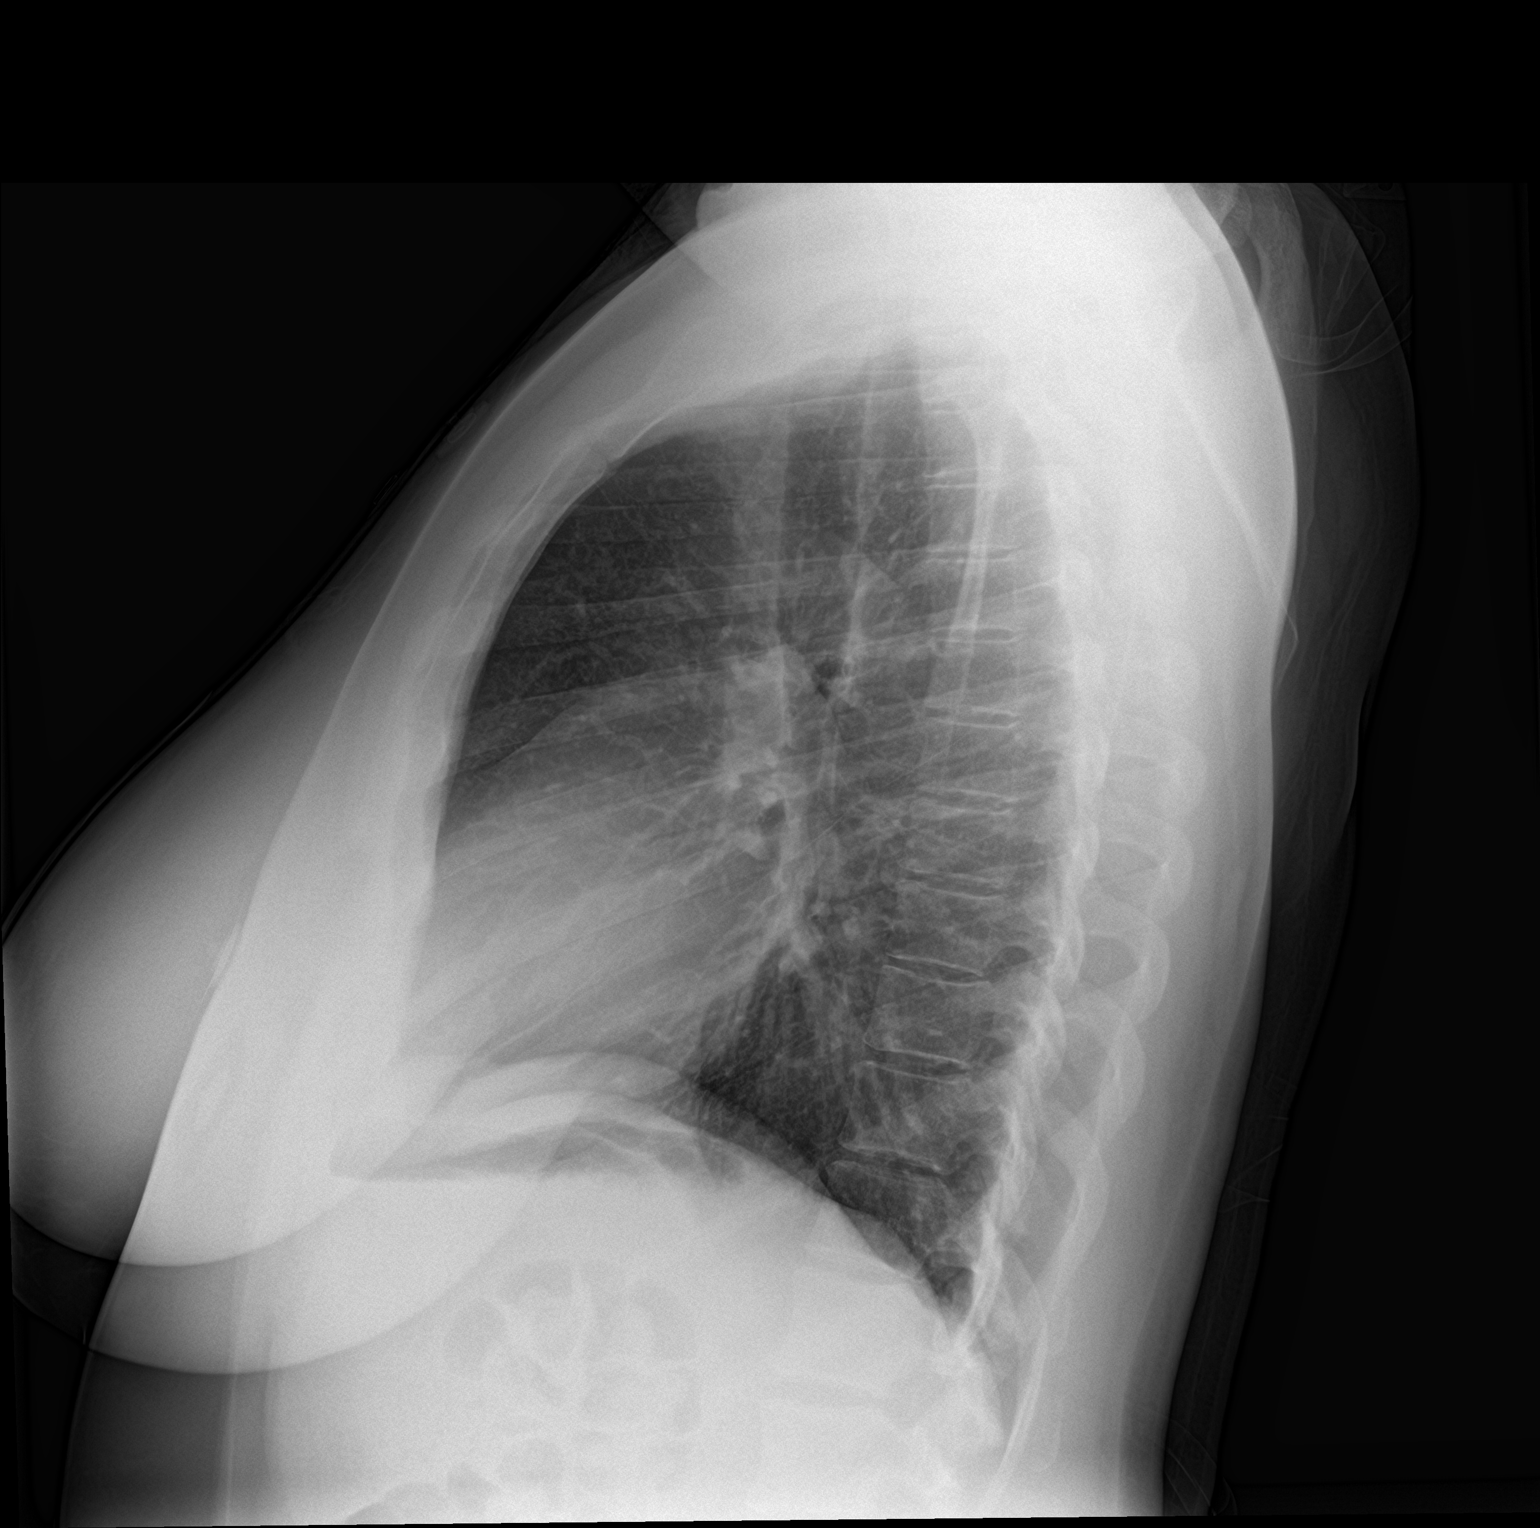

[2 of 2 positions shown; findings below may reference images not displayed]

FINDINGS: Cardiac size is within normal limits. There is interval decrease in
transverse diameter of the superior mediastinum and decrease in
prominence of both hilar regions, possibly suggesting interval
resolution of lymphadenopathy. There is interval clearing of patchy
infiltrates in both lungs. There are no signs of pulmonary edema or
focal pulmonary consolidation in the current study. There is no
pleural effusion or pneumothorax.
IMPRESSION: No active cardiopulmonary disease.
